# Patient Record
Sex: Male | Born: 1960 | Race: White | Hispanic: No | Marital: Married | Smoking: Former smoker
Health system: Southern US, Community
[De-identification: ages and names within clinical notes are randomized; demographics above are authoritative.]

## PROBLEM LIST (undated history)

## (undated) DIAGNOSIS — D126 Benign neoplasm of colon, unspecified: Secondary | ICD-10-CM

## (undated) DIAGNOSIS — K219 Gastro-esophageal reflux disease without esophagitis: Secondary | ICD-10-CM

## (undated) DIAGNOSIS — H9190 Unspecified hearing loss, unspecified ear: Secondary | ICD-10-CM

## (undated) DIAGNOSIS — K279 Peptic ulcer, site unspecified, unspecified as acute or chronic, without hemorrhage or perforation: Secondary | ICD-10-CM

## (undated) DIAGNOSIS — M8580 Other specified disorders of bone density and structure, unspecified site: Secondary | ICD-10-CM

## (undated) DIAGNOSIS — E119 Type 2 diabetes mellitus without complications: Secondary | ICD-10-CM

## (undated) DIAGNOSIS — E785 Hyperlipidemia, unspecified: Secondary | ICD-10-CM

## (undated) HISTORY — DX: Other specified disorders of bone density and structure, unspecified site: M85.80

## (undated) HISTORY — DX: Peptic ulcer, site unspecified, unspecified as acute or chronic, without hemorrhage or perforation: K27.9

## (undated) HISTORY — DX: Type 2 diabetes mellitus without complications: E11.9

## (undated) HISTORY — DX: Benign neoplasm of colon, unspecified: D12.6

## (undated) HISTORY — PX: HAND SURGERY: SHX662

## (undated) HISTORY — DX: Gilbert syndrome: E80.4

## (undated) HISTORY — DX: Hyperlipidemia, unspecified: E78.5

## (undated) HISTORY — DX: Gastro-esophageal reflux disease without esophagitis: K21.9

## (undated) HISTORY — PX: KNEE SURGERY: SHX244

## (undated) HISTORY — DX: Unspecified hearing loss, unspecified ear: H91.90

---

## 1999-07-13 ENCOUNTER — Encounter: Admission: RE | Admit: 1999-07-13 | Discharge: 1999-10-11 | Payer: Self-pay | Admitting: *Deleted

## 2000-02-29 ENCOUNTER — Encounter: Payer: Self-pay | Admitting: *Deleted

## 2000-02-29 ENCOUNTER — Encounter: Admission: RE | Admit: 2000-02-29 | Discharge: 2000-02-29 | Payer: Self-pay | Admitting: *Deleted

## 2000-08-30 ENCOUNTER — Encounter: Admission: RE | Admit: 2000-08-30 | Discharge: 2000-08-30 | Payer: Self-pay | Admitting: *Deleted

## 2000-08-30 ENCOUNTER — Encounter: Payer: Self-pay | Admitting: *Deleted

## 2004-07-16 ENCOUNTER — Encounter: Admission: RE | Admit: 2004-07-16 | Discharge: 2004-07-16 | Payer: Self-pay | Admitting: Internal Medicine

## 2005-11-10 ENCOUNTER — Encounter: Admission: RE | Admit: 2005-11-10 | Discharge: 2005-11-10 | Payer: Self-pay | Admitting: Internal Medicine

## 2006-09-18 ENCOUNTER — Emergency Department (HOSPITAL_COMMUNITY): Admission: EM | Admit: 2006-09-18 | Discharge: 2006-09-18 | Payer: Self-pay

## 2007-03-08 ENCOUNTER — Encounter (INDEPENDENT_AMBULATORY_CARE_PROVIDER_SITE_OTHER): Payer: Self-pay | Admitting: Specialist

## 2007-03-08 ENCOUNTER — Ambulatory Visit (HOSPITAL_COMMUNITY): Admission: RE | Admit: 2007-03-08 | Discharge: 2007-03-08 | Payer: Self-pay | Admitting: Gastroenterology

## 2008-04-29 ENCOUNTER — Encounter: Admission: RE | Admit: 2008-04-29 | Discharge: 2008-04-29 | Payer: Self-pay | Admitting: Internal Medicine

## 2008-06-07 ENCOUNTER — Ambulatory Visit (HOSPITAL_BASED_OUTPATIENT_CLINIC_OR_DEPARTMENT_OTHER): Admission: RE | Admit: 2008-06-07 | Discharge: 2008-06-07 | Payer: Self-pay | Admitting: Orthopedic Surgery

## 2009-07-07 ENCOUNTER — Emergency Department (HOSPITAL_COMMUNITY): Admission: EM | Admit: 2009-07-07 | Discharge: 2009-07-07 | Payer: Self-pay | Admitting: Family Medicine

## 2011-05-04 NOTE — Op Note (Signed)
Louis Joseph, BRIGHAM            ACCOUNT NO.:  [id number]   MEDICAL RECORD NO.:  [id number]          PATIENT TYPE:  AMB   LOCATION:  DSC                          FACILITY:  [facility]   PHYSICIAN:  Katy Fitch. Sypher, M.D. 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   OPERATION:  Release of right long finger A1 pulley.   OPERATING SURGEON:  Katy Fitch. Sypher, MD   ASSISTANT:  Marveen Reeks. Dasnoit, PA-C.   ANESTHESIA:  General sedation and 2% lidocaine, metacarpal head-level  block, right long finger.   SUPERVISING ANESTHESIOLOGIST:  Quita Skye. Krista Blue, MD   INDICATIONS:  Arrion Broaddus is a 50 year old gentleman, referred  through the courtesy of Dr. Rodrigo Ran for evaluation and management of  chronic stenosing tenosynovitis of the right long finger.  He has type 2  diabetes, managed by glimepiride, metformin, and Lantus insulin.   He has developed chronic stenosing tenosynovitis of his right long  finger, unresponsive to nonoperative measures.   He is now brought to the operating at this time for release of his right  long finger A1 pulley.   PROCEDURE:  Lalla Brothers was brought to the operating room and  placed in supine position upon the operating table.   Following light sedation, the right arm was prepped with Betadine soap  solution and sterilely draped.  A pneumatic tourniquet was applied to  the proximal right brachium.   A  2% lidocaine was infiltrated in the path of the intended incision  followed by exsanguination of the limb with an Esmarch bandage and  inflation of the arterial tourniquet to 230 mmHg.   A 1-cm incision was fashioned directly over  the thickened A1 pulley.  Subcutaneous tissues were carefully divided revealing hypertrophic  palmar fascia, particularly the pretendinous fibers to long finger.  This was likely related to his diabetes.  This was released with  scissors followed by identification of the A1 pulley.  The pulley was  split with the scalpel and scissors.  A small A0 pulley was noted.  This  was also split.   The tendons were delivered.  A small area of necrotic tendon tissue was  debrided with scissors and a micro-rongeur.   Thereafter, full range of motion of fingers recovered.   The wound was then repaired with mattress suture of 5-0 nylon.   A compressor dressing was applied with Xeroflo sterile gauze and an Ace  bandage.   Mr. Rivenburg would be discharged with a prescription for Darvocet-N 100 one  p.o. q.4-6 hours p.r.n. pain, 20 tablets without refill.      Katy Fitch Sypher, M.D.  Electronically Signed     RVS/MEDQ  D:  06/07/2008  T:  06/07/2008  Job:  [id number]   cc:   Loraine Leriche A. Perini, M.D.

## 2011-05-07 NOTE — Op Note (Signed)
Louis Joseph, Louis Joseph            ACCOUNT NO.:  [id number]   MEDICAL RECORD NO.:  [id number]          PATIENT TYPE:  AMB   LOCATION:  ENDO                         FACILITY:  [facility]   PHYSICIAN:  Danise Edge, M.D.   DATE OF BIRTH:  [birth year]   DATE OF PROCEDURE:  03/08/2007  DATE OF DISCHARGE:                               OPERATIVE REPORT   REFERRING PHYSICIAN:  Mark A. Perini, M.D.   PROCEDURE INDICATIONS:  [name] is a 50 year old being male  born [birth year].  [name] is undergoing diagnostic  esophagogastroduodenoscopy to evaluate intermittent dysphagia and  intermittent breakthrough heartburn despite taking Prevacid twice daily.   [name] underwent an esophagogastroduodenoscopy in his early 81s.   ENDOSCOPIST:  Danise Edge, M.D.   PREMEDICATION:  Fentanyl 100 mcg, Versed 10 mg.   PROCEDURE:  After obtaining informed consent, [name] was placed in the  left lateral decubitus position on the fluoroscopy table.  I  administered intravenous fentanyl and intravenous Versed to achieve  conscious sedation for the procedure.  The patient's blood pressure,  oxygen saturation and cardiac rhythm were monitored throughout the  procedure and documented in the medical record.   The Olympus gastroscope was passed through the posterior hypopharynx  into the proximal esophagus without difficulty.  The hypopharynx, larynx  and vocal cords appeared normal.   Esophagoscopy:  The proximal mid and lower segments of the esophageal  mucosa appear completely normal.  The squamocolumnar junction and  esophagogastric junction are noted at approximately 40 cm from the  incisor teeth.  There is no endoscopic evidence for the presence of  esophageal obstruction, esophageal stricture formation, erosive  esophagitis or Barrett's esophagus.   Gastroscopy:  Retroflex view of the gastric cardia and fundus was  normal.  The gastric body appeared normal.  There are scattered small  circular erosions in the prepyloric gastric antrum.  Biopsies were taken  from the prepyloric gastric antrum.  The pylorus appears normal.   Duodenoscopy:  The duodenal bulb, second portion of duodenum and third  portion of duodenum appeared normal.   Biopsies:  Multiple biopsies were taken from the esophagus to rule out  eosinophilic esophagitis.  Prepyloric gastric biopsies were performed to  evaluate the gastric erosions.           ______________________________  Danise Edge, M.D.     MJ/MEDQ  D:  03/08/2007  T:  03/08/2007  Job:  [id number]   cc:   Loraine Leriche A. Perini, M.D.

## 2011-07-05 ENCOUNTER — Other Ambulatory Visit: Payer: Self-pay | Admitting: Gastroenterology

## 2011-09-16 LAB — BASIC METABOLIC PANEL
CO2: 31
Calcium: 9.4
Glucose, Bld: 191 — ABNORMAL HIGH
Sodium: 139

## 2012-12-02 ENCOUNTER — Emergency Department (HOSPITAL_COMMUNITY)
Admission: EM | Admit: 2012-12-02 | Discharge: 2012-12-02 | Disposition: A | Discharge status: Home / Self Care | Payer: BC Managed Care – PPO | Source: Home / Self Care | Attending: Emergency Medicine | Admitting: Emergency Medicine

## 2012-12-02 ENCOUNTER — Encounter (HOSPITAL_COMMUNITY): Payer: Self-pay | Admitting: Emergency Medicine

## 2012-12-02 ENCOUNTER — Emergency Department (INDEPENDENT_AMBULATORY_CARE_PROVIDER_SITE_OTHER): Payer: BC Managed Care – PPO

## 2012-12-02 DIAGNOSIS — S61409A Unspecified open wound of unspecified hand, initial encounter: Secondary | ICD-10-CM

## 2012-12-02 DIAGNOSIS — S61419A Laceration without foreign body of unspecified hand, initial encounter: Secondary | ICD-10-CM

## 2012-12-02 HISTORY — DX: Type 2 diabetes mellitus without complications: E11.9

## 2012-12-02 MED ORDER — IBUPROFEN 600 MG PO TABS: 600.0000 mg | ORAL_TABLET | Freq: Four times a day (QID) | ORAL | Status: DC | PRN

## 2012-12-02 NOTE — ED Notes (Signed)
Pt c/o laceration of the left hand between thumb and index finger. Pt states that around 10 a.m he was removing a door off of a stove and the spring caused laceration. Pt is able to open and close hand.   Pt thinks last tetanus has been within a five year period

## 2012-12-02 NOTE — ED Provider Notes (Signed)
History     CSN: [id number]  Arrival date & time 12/02/12  1215   First MD Initiated Contact with Patient 12/02/12 1331      Chief Complaint  Patient presents with  . Extremity Laceration    laceration to left hand between thumb and index finger    (Consider location/radiation/quality/duration/timing/severity/associated sxs/prior treatment) HPI Comments: Today, while working in installing a new stove Pt c/o laceration of the left hand between thumb and index finger. Pt states that around 10 a.m he was removing a door off of a stove and the spring caused laceration. Denies any numbness, or tingling sensation. Able to move his thumb but with pain. (Patient points to the first metacarpophalangeal joint)   Patient is a 51 y.o. male presenting with skin laceration. The history is provided by the patient.  Laceration  The incident occurred 1 to 2 hours ago. The laceration is located on the left arm. The laceration is 2 cm in size. The pain is at a severity of 5/10. The pain is moderate. The pain has been constant since onset. He reports no foreign bodies present. His tetanus status is UTD.    Past Medical History  Diagnosis Date  . Diabetes mellitus without complication     Past Surgical History  Procedure Date  . Knee surgery   . Hand surgery     Family History  Problem Relation Age of Onset  . Diabetes Other     History  Substance Use Topics  . Smoking status: Current Some Day Smoker -- 0.5 packs/day    Types: Cigarettes  . Smokeless tobacco: Not on file  . Alcohol Use: No      Review of Systems  Constitutional: Positive for activity change. Negative for fever, chills, diaphoresis, appetite change, fatigue and unexpected weight change.  Skin: Positive for wound. Negative for color change and pallor.  Neurological: Negative for weakness and numbness.    Allergies  Codeine  Home Medications   Current Outpatient Rx  Name  Route  Sig  Dispense  Refill  .  INSULIN PUMP   Subcutaneous   Inject into the skin.         Marland Kitchen PREVACID 24HR PO   Oral   Take by mouth.         . METFORMIN HCL PO   Oral   Take by mouth.           BP 122/76  Pulse 58  Temp 98.1 F (36.7 C) (Oral)  Resp 17  SpO2 98%  Physical Exam  Nursing note and vitals reviewed. Constitutional: Vital signs are normal. He appears well-developed and well-nourished.  Non-toxic appearance. He does not have a sickly appearance. He does not appear ill.  Musculoskeletal: He exhibits tenderness.       Left hand: He exhibits decreased range of motion, tenderness, bony tenderness and laceration. He exhibits normal two-point discrimination, normal capillary refill, no deformity and no swelling.       Hands: Neurological: He is alert.  Skin: No erythema.    ED Course  LACERATION REPAIR Performed by: Zyanne Schumm Authorized by: Jimmie Molly Consent: Verbal consent obtained. Risks and benefits: risks, benefits and alternatives were discussed Consent given by: patient Patient understanding: patient states understanding of the procedure being performed Patient identity confirmed: verbally with patient Body area: upper extremity Location details: left hand Tendon involvement: superficial Anesthesia: local infiltration Local anesthetic: lidocaine 1% without epinephrine Anesthetic total (ml): 5. Patient sedated: no Irrigation solution: saline Debridement:  minimal Skin closure: 4-0 Prolene Number of sutures: 4 Patient tolerance: Patient tolerated the procedure well with no immediate complications.   (including critical care time)  Labs Reviewed - No data to display No results found.   No diagnosis found.    MDM  Laceration interdigital space- 1st-2nd L thumb neurological deficits- and no tendon injury suspected.        Jimmie Molly, MD 12/02/12 [phone]

## 2013-08-14 DIAGNOSIS — M25569 Pain in unspecified knee: Secondary | ICD-10-CM | POA: Insufficient documentation

## 2013-08-15 ENCOUNTER — Other Ambulatory Visit: Payer: Self-pay | Admitting: Orthopedic Surgery

## 2013-08-15 DIAGNOSIS — M25561 Pain in right knee: Secondary | ICD-10-CM

## 2013-08-24 ENCOUNTER — Ambulatory Visit
Admission: RE | Admit: 2013-08-24 | Discharge: 2013-08-24 | Disposition: A | Discharge status: Home / Self Care | Payer: BC Managed Care – PPO | Source: Ambulatory Visit | Attending: Orthopedic Surgery | Admitting: Orthopedic Surgery

## 2013-08-24 DIAGNOSIS — M25561 Pain in right knee: Secondary | ICD-10-CM

## 2014-10-23 ENCOUNTER — Other Ambulatory Visit: Payer: Self-pay | Admitting: Gastroenterology

## 2014-12-02 ENCOUNTER — Encounter (HOSPITAL_COMMUNITY)
Admission: RE | Disposition: A | Discharge status: Home / Self Care | Payer: Self-pay | Source: Ambulatory Visit | Attending: Gastroenterology

## 2014-12-02 ENCOUNTER — Encounter (HOSPITAL_COMMUNITY): Payer: Self-pay | Admitting: *Deleted

## 2014-12-02 ENCOUNTER — Ambulatory Visit (HOSPITAL_COMMUNITY)
Admission: RE | Admit: 2014-12-02 | Discharge: 2014-12-02 | Disposition: A | Discharge status: Home / Self Care | Payer: BC Managed Care – PPO | Source: Ambulatory Visit | Attending: Gastroenterology | Admitting: Gastroenterology

## 2014-12-02 DIAGNOSIS — D123 Benign neoplasm of transverse colon: Secondary | ICD-10-CM | POA: Insufficient documentation

## 2014-12-02 DIAGNOSIS — E780 Pure hypercholesterolemia: Secondary | ICD-10-CM | POA: Insufficient documentation

## 2014-12-02 DIAGNOSIS — Z1211 Encounter for screening for malignant neoplasm of colon: Secondary | ICD-10-CM | POA: Diagnosis present

## 2014-12-02 DIAGNOSIS — M8580 Other specified disorders of bone density and structure, unspecified site: Secondary | ICD-10-CM | POA: Diagnosis not present

## 2014-12-02 DIAGNOSIS — E119 Type 2 diabetes mellitus without complications: Secondary | ICD-10-CM | POA: Diagnosis not present

## 2014-12-02 HISTORY — PX: COLONOSCOPY: SHX5424

## 2014-12-02 LAB — GLUCOSE, CAPILLARY: GLUCOSE-CAPILLARY: 181 mg/dL — AB (ref 70–99)

## 2014-12-02 SURGERY — COLONOSCOPY
Anesthesia: Moderate Sedation

## 2014-12-02 MED ORDER — MIDAZOLAM HCL 5 MG/5ML IJ SOLN
INTRAMUSCULAR | Status: DC | PRN
  Administered 2014-12-02 (×4): 2.5 mg via INTRAVENOUS

## 2014-12-02 MED ORDER — FENTANYL CITRATE 0.05 MG/ML IJ SOLN
INTRAMUSCULAR | Status: DC | PRN
  Administered 2014-12-02: 50 ug via INTRAVENOUS
  Administered 2014-12-02: 25 ug via INTRAVENOUS

## 2014-12-02 MED ORDER — DIPHENHYDRAMINE HCL 50 MG/ML IJ SOLN
INTRAMUSCULAR | Status: AC
  Filled 2014-12-02: qty 1

## 2014-12-02 MED ORDER — FENTANYL CITRATE 0.05 MG/ML IJ SOLN
INTRAMUSCULAR | Status: AC
  Filled 2014-12-02: qty 4

## 2014-12-02 MED ORDER — MIDAZOLAM HCL 10 MG/2ML IJ SOLN
INTRAMUSCULAR | Status: AC
  Filled 2014-12-02: qty 4

## 2014-12-02 NOTE — H&P (Signed)
  Procedure: Surveillance colonoscopy. Colonoscopy with removal of nine adenomatous colon polyps performed on 07/04/2011  History: The patient is a 53 year old male born 610/25/1962. He is scheduled to undergo a surveillance colonoscopy with polypectomy to prevent colon cancer.  Medication allergies: None  Past medical history: Knee arthroscopy. Hypercholesterolemia. Type 2 diabetes mellitus. Degenerative joint disease. Osteopenia. Peptic ulcer disease.  Exam: The patient is alert and lying comfortably on the endoscopy stretcher. Abdomen is soft and nontender to palpation. Lungs are clear to auscultation. Cardiac exam reveals a regular rhythm.  Plan: Proceed with surveillance colonoscopy

## 2014-12-02 NOTE — Discharge Instructions (Signed)

## 2014-12-02 NOTE — Op Note (Signed)
Procedure: Surveillance colonoscopy. Colonoscopy with removal of nine adenomatous colon polyps performed on 07/04/2011  Endoscopist: Earle Gell  Premedication: Versed 10 mg. Fentanyl 75 g.  Procedure: The patient was placed in the left lateral decubitus position. Anal inspection and digital rectal exam were normal. The Pentax pediatric colonoscope was introduced into the rectum and advanced to the cecum. A normal-appearing ileocecal valve and appendiceal orifice were identified. Colonic preparation for the exam today was good. Withdrawal time was 23 minutes.  Rectum. Normal. Retroflexed view of the distal rectum normal  Sigmoid colon and descending colon. Normal  Splenic flexure. Normal  Transverse colon. From the distal transverse colon, two 4 mm sessile polyps were removed with the cold snare  Hepatic flexure. Normal  Ascending colon. Normal  Cecum and ileocecal valve. Normal  Assessment: Two 4 mm sessile polyps were removed from the distal transverse colon; otherwise normal surveillance colonoscopy  Recommendation: Schedule repeat surveillance colonoscopy in 5 years

## 2014-12-03 ENCOUNTER — Encounter (HOSPITAL_COMMUNITY): Payer: Self-pay | Admitting: Gastroenterology

## 2015-03-17 ENCOUNTER — Encounter: Payer: Self-pay | Admitting: *Deleted

## 2015-03-19 ENCOUNTER — Telehealth: Payer: Self-pay

## 2015-03-19 ENCOUNTER — Encounter: Payer: Self-pay | Admitting: Cardiology

## 2015-03-19 ENCOUNTER — Ambulatory Visit: Payer: BC Managed Care – PPO | Admitting: Cardiology

## 2015-03-19 ENCOUNTER — Ambulatory Visit (INDEPENDENT_AMBULATORY_CARE_PROVIDER_SITE_OTHER): Payer: BC Managed Care – PPO | Admitting: Cardiology

## 2015-03-19 VITALS — BP 120/72 | HR 67 | Ht 70.5 in | Wt 197.2 lb

## 2015-03-19 DIAGNOSIS — R0602 Shortness of breath: Secondary | ICD-10-CM | POA: Diagnosis not present

## 2015-03-19 DIAGNOSIS — K219 Gastro-esophageal reflux disease without esophagitis: Secondary | ICD-10-CM | POA: Diagnosis not present

## 2015-03-19 DIAGNOSIS — M8580 Other specified disorders of bone density and structure, unspecified site: Secondary | ICD-10-CM | POA: Insufficient documentation

## 2015-03-19 DIAGNOSIS — K279 Peptic ulcer, site unspecified, unspecified as acute or chronic, without hemorrhage or perforation: Secondary | ICD-10-CM | POA: Insufficient documentation

## 2015-03-19 DIAGNOSIS — E785 Hyperlipidemia, unspecified: Secondary | ICD-10-CM | POA: Diagnosis not present

## 2015-03-19 DIAGNOSIS — E119 Type 2 diabetes mellitus without complications: Secondary | ICD-10-CM

## 2015-03-19 DIAGNOSIS — D126 Benign neoplasm of colon, unspecified: Secondary | ICD-10-CM | POA: Insufficient documentation

## 2015-03-19 DIAGNOSIS — H9190 Unspecified hearing loss, unspecified ear: Secondary | ICD-10-CM | POA: Insufficient documentation

## 2015-03-19 HISTORY — DX: Hyperlipidemia, unspecified: E78.5

## 2015-03-19 HISTORY — DX: Gastro-esophageal reflux disease without esophagitis: K21.9

## 2015-03-19 HISTORY — DX: Type 2 diabetes mellitus without complications: E11.9

## 2015-03-19 NOTE — Patient Instructions (Signed)
Your physician has requested that you have an echocardiogram. Echocardiography is a painless test that uses sound waves to create images of your heart. It provides your doctor with information about the size and shape of your heart and how well your heart's chambers and valves are working. This procedure takes approximately one hour. There are no restrictions for this procedure.  Dr. Radford Pax recommends you have a NUCLEAR STRESS TEST.  Your physician recommends that you schedule a follow-up appointment AS NEEDED with Dr. Radford Pax pending your test results.

## 2015-03-19 NOTE — Telephone Encounter (Signed)
Called patient to see if he could come in early for his appointment today at 1:00PM or 3:00PM instead of 3:30PM.  Left message to call back.

## 2015-03-19 NOTE — Progress Notes (Signed)
Cardiology Office Note   Date:  03/19/2015   ID:  Louis Joseph, Louis Joseph Apr 16, 1961, MRN [id number]  PCP:  Jerlyn Ly, MD    Chief Complaint  Patient presents with  . Shortness of Breath      History of Present Illness: Louis Joseph is a 54 y.o. male who presents for evaluation of SOB.  He says that he has noticed that he gets SOB when he exercises or dose anything strenuous.  He also has noticed at night that he cant take a full breath when he is sitting down at night.  He denies any chest pain or pressure.  He normally walks or jogs up to 2 miles last year but now cannot even do .5 miles without SOB.  He denies any LE edema, dizziness or syncope.  He occasionally notices his heart speeding up. He smoked as a teenager.  He has a family history of premature CAD with his Dad.  His Dad used to be a smoker and everyone on his Dad's side of the family has had heart problems.      Past Medical History  Diagnosis Date  . Diabetes mellitus without complication   . Dyslipidemia 03/19/2015  . GERD (gastroesophageal reflux disease) 03/19/2015  . Gilbert's syndrome   . Osteopenia   . PUD (peptic ulcer disease)   . Colon adenomas   . Hearing loss     s/p hearing aides  . DM w/o complication type II 8/92/1194    Past Surgical History  Procedure Laterality Date  . Knee surgery    . Hand surgery    . Colonoscopy N/A 12/02/2014    Procedure: COLONOSCOPY;  Surgeon: Garlan Fair, MD;  Location: [facility];  Service: Endoscopy;  Laterality: N/A;     Current Outpatient Prescriptions  Medication Sig Dispense Refill  . aspirin 81 MG tablet Take 81 mg by mouth daily. Pt takes 1 tablet 4 times a week    . CALCIUM-VITAMIN D PO Take 1 tablet by mouth 2 (two) times daily.    Marland Kitchen ibuprofen (ADVIL,MOTRIN) 600 MG tablet Take 1 tablet (600 mg total) by mouth every 6 (six) hours as needed for pain. 30 tablet 0  . Insulin Human (INSULIN PUMP) 100 unit/ml SOLN Inject into the skin.      . Lansoprazole (PREVACID 24HR PO) Take by mouth.    . metFORMIN (GLUCOPHAGE) 1000 MG tablet Take 1,000 mg by mouth 2 (two) times daily.    . ONE TOUCH ULTRA TEST test strip     . pantoprazole (PROTONIX) 40 MG tablet Take 40 mg by mouth 2 (two) times daily.    . pravastatin (PRAVACHOL) 40 MG tablet Take 40 mg by mouth daily. Pt takes 1/2 tablet by mouth in the evening.     No current facility-administered medications for this visit.    Allergies:   Codeine    Social History:  The patient  reports that he has never smoked. He has never used smokeless tobacco. He reports that he does not drink alcohol or use illicit drugs.   Family History:  The patient's family history includes Diabetes in his father and other; Heart disease in his father; Hypertension in his father and sister; Uterine cancer in his mother.    ROS:  Please see the history of present illness.   Otherwise, review of systems are positive for none.   All other systems are reviewed and negative.    PHYSICAL EXAM: VS:  BP 120/72  mmHg  Pulse 67  Ht 5' 10.5" (1.791 m)  Wt 197 lb 3.2 oz (89.449 kg)  BMI 27.89 kg/m2 , BMI Body mass index is 27.89 kg/(m^2). GEN: Well nourished, well developed, in no acute distress HEENT: normal Neck: no JVD, carotid bruits, or masses Cardiac: RRR; no murmurs, rubs, or gallops,no edema  Respiratory:  clear to auscultation bilaterally, normal work of breathing GI: soft, nontender, nondistended, + BS MS: no deformity or atrophy Skin: warm and dry, no rash Neuro:  Strength and sensation are intact Psych: euthymic mood, full affect   EKG:  EKG is ordered today. The ekg ordered today demonstrates    Recent Labs: No results found for requested labs within last 365 days.    Lipid Panel No results found for: CHOL, TRIG, HDL, CHOLHDL, VLDL, LDLCALC, LDLDIRECT    Wt Readings from Last 3 Encounters:  03/19/15 197 lb 3.2 oz (89.449 kg)  12/02/14 195 lb (88.451 kg)        ASSESSMENT  AND PLAN:  1.  SOB mainly with exertion.  He has CRF including DM, dyslipidemia and family history of premature CAD.  EKG is nonischemic.  I will get a stress myoview to rule out ischemia.  I will also get a 2D echo to assess for structural heart disease.  2.  Dyslipidemia on statins 3.  GERD 4.  DM - per PCP   Current medicines are reviewed at length with the patient today.  The patient does not have concerns regarding medicines.  The following changes have been made:  no change  Labs/ tests ordered today include: see above assessment and plan  Orders Placed This Encounter  Procedures  . Myocardial Perfusion Imaging  . EKG 12-Lead  . 2D Echocardiogram without contrast     Disposition:   FU with me PRN pending results of studies   Signed, Sueanne Margarita, MD  03/19/2015 1:50 PM    [facility] [address], [place], [place]  [address] Phone: [phone]; Fax: [phone]

## 2015-03-26 ENCOUNTER — Ambulatory Visit: Payer: BC Managed Care – PPO | Admitting: Cardiology

## 2015-04-14 ENCOUNTER — Ambulatory Visit (HOSPITAL_BASED_OUTPATIENT_CLINIC_OR_DEPARTMENT_OTHER): Payer: BC Managed Care – PPO | Admitting: Radiology

## 2015-04-14 ENCOUNTER — Ambulatory Visit (HOSPITAL_COMMUNITY): Payer: BC Managed Care – PPO | Attending: Cardiology | Admitting: Cardiology

## 2015-04-14 DIAGNOSIS — E785 Hyperlipidemia, unspecified: Secondary | ICD-10-CM | POA: Diagnosis not present

## 2015-04-14 DIAGNOSIS — R002 Palpitations: Secondary | ICD-10-CM | POA: Insufficient documentation

## 2015-04-14 DIAGNOSIS — R0602 Shortness of breath: Secondary | ICD-10-CM | POA: Insufficient documentation

## 2015-04-14 DIAGNOSIS — R079 Chest pain, unspecified: Secondary | ICD-10-CM | POA: Diagnosis not present

## 2015-04-14 DIAGNOSIS — E119 Type 2 diabetes mellitus without complications: Secondary | ICD-10-CM | POA: Diagnosis not present

## 2015-04-14 MED ORDER — TECHNETIUM TC 99M SESTAMIBI GENERIC - CARDIOLITE
11.0000 | Freq: Once | INTRAVENOUS | Status: AC | PRN
  Administered 2015-04-14: 11 via INTRAVENOUS

## 2015-04-14 MED ORDER — TECHNETIUM TC 99M SESTAMIBI GENERIC - CARDIOLITE
33.0000 | Freq: Once | INTRAVENOUS | Status: AC | PRN
  Administered 2015-04-14: 33 via INTRAVENOUS

## 2015-04-14 NOTE — Progress Notes (Signed)
Echo performed. 

## 2015-04-14 NOTE — Progress Notes (Signed)
[facility] [address] [place], [place] [address] [phone]    Cardiology Nuclear Med Study  Louis Joseph is a 54 y.o. male     MRN : [id number]     DOB: [birth year]  Procedure Date: 04/14/2015  Nuclear Med Background Indication for Stress Test:  Evaluation for Ischemia History:  No known CAD Cardiac Risk Factors: IDDM, and Dyslipidemia  Symptoms:  Chest Pain, DOE and Palpitations   Nuclear Pre-Procedure Caffeine/Decaff Intake:  7:00am chocolate chip muffin NPO After: 7:00am   Lungs:  clear O2 Sat: 97% on room air. IV 0.9% NS with Angio Cath:  20g  IV Site: R Antecubital x 1, tolerated well IV Started by:  Irven Baltimore, RN  Chest Size (in):  42 Cup Size: n/a  Height: 5\' 10"  (1.778 m)  Weight:  193 lb (87.544 kg)  BMI:  Body mass index is 27.69 kg/(m^2). Tech Comments: Fasting CBG was 147 at 0700 with 3/4 dose insulin bolus given via insulin pump per patient. No metformin this am. CBG was 257 at 09:30am. Irven Baltimore, RN.    Nuclear Med Study 1 or 2 day study: 1 day  Stress Test Type:  Stress  Reading MD: N/A  Order Authorizing Provider:  Fransico Him, MD  Resting Radionuclide: Technetium 6m Sestamibi  Resting Radionuclide Dose: 11.0 mCi   Stress Radionuclide:  Technetium 33m Sestamibi  Stress Radionuclide Dose: 33.0 mCi           Stress Protocol Rest HR: 54 Stress HR: 153  Rest BP: 131/80 Stress BP: 200/70  Exercise Time (min): 9:00 METS: 10.1   Predicted Max HR: 166 bpm % Max HR: 92.17 bpm Rate Pressure Product: 30600   Dose of Adenosine (mg):  n/a Dose of Lexiscan: n/a mg  Dose of Atropine (mg): n/a Dose of Dobutamine: n/a mcg/kg/min (at max HR)  Stress Test Technologist: Glade Lloyd, BS-ES  Nuclear Technologist:  Annye Rusk, CNMT     Rest Procedure:  Myocardial perfusion imaging was performed at rest 45 minutes following the intravenous administration of Technetium 27m Sestamibi. Rest ECG: Sinus bradycardia,  RAD.  Stress Procedure:  The patient exercised on the treadmill utilizing the Bruce Protocol for 9:00 minutes. The patient stopped due to SOB and denied any chest pain.  Technetium 35m Sestamibi was injected at peak exercise and myocardial perfusion imaging was performed after a brief delay. Stress ECG: No significant ST segment change suggestive of ischemia.  QPS Raw Data Images:  Acquisition technically good; normal left ventricular size. Stress Images:  There is decreased uptake in the anterior and inferior walls. Rest Images:  There is decreased uptake in the anterior and inferior walls. Subtraction (SDS):  No evidence of ischemia. Transient Ischemic Dilatation (Normal <1.22):  0.86 Lung/Heart Ratio (Normal <0.45):  0.24  Quantitative Gated Spect Images QGS EDV:  108 ml QGS ESV:  44 ml  Impression Exercise Capacity:  Good exercise capacity. BP Response:  Normal blood pressure response. Clinical Symptoms:  There is dyspnea. ECG Impression:  No significant ST segment change suggestive of ischemia. Comparison with Prior Nuclear Study: No previous nuclear study performed  Overall Impression:  Low risk stress nuclear study with small, moderate intensity, fixed inferior and anterior defects consistent with soft tissue attenuation; cannot exclude small prior infarct; no ischemia.  LV Ejection Fraction: 59%.  LV Wall Motion:  NL LV Function; NL Wall Motion  [name]

## 2015-04-15 ENCOUNTER — Encounter: Payer: Self-pay | Admitting: Cardiology

## 2015-04-15 NOTE — Telephone Encounter (Signed)
This encounter was created in error - please disregard.

## 2015-04-15 NOTE — Telephone Encounter (Signed)
Follow Up ° ° ° ° ° °Pt returning Katy's phone call for results. °

## 2015-09-05 ENCOUNTER — Inpatient Hospital Stay (HOSPITAL_COMMUNITY)
Admission: EM | Admit: 2015-09-05 | Discharge: 2015-09-08 | DRG: 872 | Disposition: A | Discharge status: Home / Self Care | Payer: BC Managed Care – PPO | Attending: Internal Medicine | Admitting: Internal Medicine

## 2015-09-05 ENCOUNTER — Encounter (HOSPITAL_COMMUNITY): Payer: Self-pay | Admitting: Emergency Medicine

## 2015-09-05 DIAGNOSIS — K529 Noninfective gastroenteritis and colitis, unspecified: Secondary | ICD-10-CM

## 2015-09-05 DIAGNOSIS — M791 Myalgia, unspecified site: Secondary | ICD-10-CM

## 2015-09-05 DIAGNOSIS — R509 Fever, unspecified: Secondary | ICD-10-CM | POA: Diagnosis not present

## 2015-09-05 DIAGNOSIS — E1165 Type 2 diabetes mellitus with hyperglycemia: Secondary | ICD-10-CM | POA: Diagnosis present

## 2015-09-05 DIAGNOSIS — Z794 Long term (current) use of insulin: Secondary | ICD-10-CM

## 2015-09-05 DIAGNOSIS — K279 Peptic ulcer, site unspecified, unspecified as acute or chronic, without hemorrhage or perforation: Secondary | ICD-10-CM | POA: Diagnosis present

## 2015-09-05 DIAGNOSIS — Z8249 Family history of ischemic heart disease and other diseases of the circulatory system: Secondary | ICD-10-CM

## 2015-09-05 DIAGNOSIS — R112 Nausea with vomiting, unspecified: Secondary | ICD-10-CM | POA: Diagnosis present

## 2015-09-05 DIAGNOSIS — E871 Hypo-osmolality and hyponatremia: Secondary | ICD-10-CM | POA: Diagnosis present

## 2015-09-05 DIAGNOSIS — Z8049 Family history of malignant neoplasm of other genital organs: Secondary | ICD-10-CM

## 2015-09-05 DIAGNOSIS — K219 Gastro-esophageal reflux disease without esophagitis: Secondary | ICD-10-CM | POA: Diagnosis present

## 2015-09-05 DIAGNOSIS — Z833 Family history of diabetes mellitus: Secondary | ICD-10-CM

## 2015-09-05 DIAGNOSIS — E785 Hyperlipidemia, unspecified: Secondary | ICD-10-CM | POA: Diagnosis present

## 2015-09-05 DIAGNOSIS — N179 Acute kidney failure, unspecified: Secondary | ICD-10-CM | POA: Diagnosis present

## 2015-09-05 DIAGNOSIS — E860 Dehydration: Secondary | ICD-10-CM | POA: Diagnosis present

## 2015-09-05 DIAGNOSIS — E119 Type 2 diabetes mellitus without complications: Secondary | ICD-10-CM

## 2015-09-05 DIAGNOSIS — M8580 Other specified disorders of bone density and structure, unspecified site: Secondary | ICD-10-CM | POA: Diagnosis present

## 2015-09-05 DIAGNOSIS — Z9641 Presence of insulin pump (external) (internal): Secondary | ICD-10-CM | POA: Diagnosis present

## 2015-09-05 DIAGNOSIS — A419 Sepsis, unspecified organism: Secondary | ICD-10-CM | POA: Diagnosis present

## 2015-09-05 DIAGNOSIS — R519 Headache, unspecified: Secondary | ICD-10-CM

## 2015-09-05 DIAGNOSIS — R51 Headache: Secondary | ICD-10-CM

## 2015-09-05 DIAGNOSIS — H9190 Unspecified hearing loss, unspecified ear: Secondary | ICD-10-CM

## 2015-09-05 DIAGNOSIS — R197 Diarrhea, unspecified: Secondary | ICD-10-CM

## 2015-09-05 LAB — URINE MICROSCOPIC-ADD ON

## 2015-09-05 LAB — CBC WITH DIFFERENTIAL/PLATELET
Basophils Absolute: 0 10*3/uL (ref 0.0–0.1)
Basophils Relative: 0 %
EOS ABS: 0 10*3/uL (ref 0.0–0.7)
EOS PCT: 0 %
HCT: 43.3 % (ref 39.0–52.0)
Hemoglobin: 15.8 g/dL (ref 13.0–17.0)
LYMPHS ABS: 0.8 10*3/uL (ref 0.7–4.0)
LYMPHS PCT: 8 %
MCH: 31.9 pg (ref 26.0–34.0)
MCHC: 36.5 g/dL — AB (ref 30.0–36.0)
MCV: 87.3 fL (ref 78.0–100.0)
MONO ABS: 0.6 10*3/uL (ref 0.1–1.0)
MONOS PCT: 7 %
Neutro Abs: 8.4 10*3/uL — ABNORMAL HIGH (ref 1.7–7.7)
Neutrophils Relative %: 85 %
PLATELETS: 180 10*3/uL (ref 150–400)
RBC: 4.96 MIL/uL (ref 4.22–5.81)
RDW: 11.8 % (ref 11.5–15.5)
WBC: 9.8 10*3/uL (ref 4.0–10.5)

## 2015-09-05 LAB — I-STAT CG4 LACTIC ACID, ED
LACTIC ACID, VENOUS: 1.77 mmol/L (ref 0.5–2.0)
Lactic Acid, Venous: 2.04 mmol/L (ref 0.5–2.0)

## 2015-09-05 LAB — URINALYSIS, ROUTINE W REFLEX MICROSCOPIC
BILIRUBIN URINE: NEGATIVE
Hgb urine dipstick: NEGATIVE
KETONES UR: 15 mg/dL — AB
LEUKOCYTES UA: NEGATIVE
Nitrite: NEGATIVE
PROTEIN: 30 mg/dL — AB
Specific Gravity, Urine: 1.037 — ABNORMAL HIGH (ref 1.005–1.030)
Urobilinogen, UA: 1 mg/dL (ref 0.0–1.0)
pH: 6.5 (ref 5.0–8.0)

## 2015-09-05 LAB — COMPREHENSIVE METABOLIC PANEL
ALK PHOS: 82 U/L (ref 38–126)
ALT: 31 U/L (ref 17–63)
ANION GAP: 10 (ref 5–15)
AST: 38 U/L (ref 15–41)
Albumin: 4 g/dL (ref 3.5–5.0)
BILIRUBIN TOTAL: 1.8 mg/dL — AB (ref 0.3–1.2)
BUN: 13 mg/dL (ref 6–20)
CALCIUM: 9.3 mg/dL (ref 8.9–10.3)
CO2: 22 mmol/L (ref 22–32)
Chloride: 99 mmol/L — ABNORMAL LOW (ref 101–111)
Creatinine, Ser: 1.3 mg/dL — ABNORMAL HIGH (ref 0.61–1.24)
GFR calc Af Amer: >60 mL/min (ref >60)
Glucose, Bld: 306 mg/dL — ABNORMAL HIGH (ref 65–99)
POTASSIUM: 3.9 mmol/L (ref 3.5–5.1)
Sodium: 131 mmol/L — ABNORMAL LOW (ref 135–145)
TOTAL PROTEIN: 6.8 g/dL (ref 6.5–8.1)

## 2015-09-05 LAB — CBG MONITORING, ED: GLUCOSE-CAPILLARY: 309 mg/dL — AB (ref 65–99)

## 2015-09-05 MED ORDER — SODIUM CHLORIDE 0.9 % IV BOLUS (SEPSIS)
1000.0000 mL | Freq: Once | INTRAVENOUS | Status: AC
  Administered 2015-09-05: 1000 mL via INTRAVENOUS

## 2015-09-05 MED ORDER — IBUPROFEN 800 MG PO TABS
800.0000 mg | ORAL_TABLET | Freq: Once | ORAL | Status: AC
  Administered 2015-09-05: 800 mg via ORAL

## 2015-09-05 NOTE — ED Provider Notes (Signed)
CSN: [id number]     Arrival date & time 09/05/15  1844 History   First MD Initiated Contact with Patient 09/05/15 2015     Chief Complaint  Patient presents with  . Fever  . Emesis     (Consider location/radiation/quality/duration/timing/severity/associated sxs/prior Treatment) HPI  This is a 54 year old man with past medical history of insulin-dependent diabetes, peptic ulcer disease, previous history of [place] spotted fever who presents emergency Department with 2 weeks of fever, headaches, nausea, vomiting and diarrhea. Patient states that his symptoms began over Labor Day weekend while he was visiting in [facility]. He had myalgias, fever, body aches, multiple episodes of diarrhea and vomiting, nonbloody, nonbilious vomitus. Patient states that over the weekend. He has progressively worsened. The patient saw his primary care physician 2 days ago, [facility] spotted fever titers were drawn and the patient was started on doxycycline 100 mg twice a day. He's had 4 doses. Patient reached a MAXIMUM TEMPERATURE of 103.1 earlier today, called his PCP and was told to come to the emergency department. He denies any current vomiting or nausea. Several episodes over the last 24 hours. He denies any current diarrhea, cough, urinary symptoms. He does complain of mild, constant, global headache which is worse after exertion or time outside in the heat. He denies photophobia, phonophobia, neck stiffness. Patient denies rash.  Past Medical History  Diagnosis Date  . Diabetes mellitus without complication   . Dyslipidemia 03/19/2015  . GERD (gastroesophageal reflux disease) 03/19/2015  . Gilbert's syndrome   . Osteopenia   . PUD (peptic ulcer disease)   . Colon adenomas   . Hearing loss     s/p hearing aides  . DM w/o complication type II 9/62/8366   Past Surgical History  Procedure Laterality Date  . Knee surgery    . Hand surgery    . Colonoscopy N/A 12/02/2014    Procedure:  COLONOSCOPY;  Surgeon: Garlan Fair, MD;  Location: [facility];  Service: Endoscopy;  Laterality: N/A;   Family History  Problem Relation Age of Onset  . Diabetes Other   . Uterine cancer Mother   . Heart disease Father   . Diabetes Father   . Hypertension Father   . Hypertension Sister    Social History  Substance Use Topics  . Smoking status: Never Smoker   . Smokeless tobacco: Never Used  . Alcohol Use: No    Review of Systems  Ten systems reviewed and are negative for acute change, except as noted in the HPI.    Allergies  Codeine  Home Medications   Prior to Admission medications   Medication Sig Start Date End Date Taking? Authorizing Provider  aspirin 81 MG tablet Take 81 mg by mouth daily. Pt takes 1 tablet 4 times a week   Yes Historical Provider, MD  CALCIUM-VITAMIN D PO Take 1 tablet by mouth 2 (two) times daily.   Yes Historical Provider, MD  doxycycline (VIBRA-TABS) 100 MG tablet Take 100 mg by mouth 2 (two) times daily. 09/04/15  Yes Historical Provider, MD  Insulin Human (INSULIN PUMP) 100 unit/ml SOLN Inject into the skin.   Yes Historical Provider, MD  Lansoprazole (PREVACID 24HR PO) Take 1 capsule by mouth daily.    Yes Historical Provider, MD  metFORMIN (GLUCOPHAGE) 1000 MG tablet Take 1,000 mg by mouth 2 (two) times daily. 03/17/15  Yes Historical Provider, MD  pantoprazole (PROTONIX) 40 MG tablet Take 40 mg by mouth 2 (two) times daily. 03/12/15  Yes Historical Provider, MD  pravastatin (PRAVACHOL) 40 MG tablet Take 20 mg by mouth daily. Pt takes 1/2 tablet by mouth in the evening. 02/13/15  Yes Historical Provider, MD   BP 121/56 mmHg  Pulse 83  Temp(Src) 101.3 F (38.5 C) (Oral)  Resp 23  Ht 5\' 11"  (1.803 m)  Wt 197 lb (89.359 kg)  BMI 27.49 kg/m2  SpO2 91% Physical Exam  Constitutional: He is oriented to person, place, and time. He appears well-developed and well-nourished. No distress.  HENT:  Head: Normocephalic and atraumatic.   Mouth/Throat: Oropharynx is clear and moist.  Eyes: Conjunctivae and EOM are normal. Pupils are equal, round, and reactive to light. No scleral icterus.  No horizontal, vertical or rotational nystagmus  Neck: Normal range of motion. Neck supple.  Full active and passive ROM without pain No midline or paraspinal tenderness No nuchal rigidity or meningeal signs  Cardiovascular: Normal rate, regular rhythm, normal heart sounds and intact distal pulses.   Pulmonary/Chest: Effort normal and breath sounds normal. No respiratory distress. He has no wheezes. He has no rales.  Abdominal: Soft. Bowel sounds are normal. He exhibits no distension and no mass. There is no tenderness. There is no rebound and no guarding.  Musculoskeletal: Normal range of motion. He exhibits no edema or tenderness.  Lymphadenopathy:    He has no cervical adenopathy.  Neurological: He is alert and oriented to person, place, and time. He has normal reflexes. No cranial nerve deficit. He exhibits normal muscle tone. Coordination normal.  Mental Status:  Alert, oriented, thought content appropriate. Speech fluent without evidence of aphasia. Able to follow 2 step commands without difficulty.  Cranial Nerves:  II:  Peripheral visual fields grossly normal, pupils equal, round, reactive to light III,IV, VI: ptosis not present, extra-ocular motions intact bilaterally  V,VII: smile symmetric, facial light touch sensation equal VIII: hearing grossly normal bilaterally  IX,X: midline uvula rise  XI: bilateral shoulder shrug equal and strong XII: midline tongue extension  Motor:  5/5 in upper and lower extremities bilaterally including strong and equal grip strength and dorsiflexion/plantar flexion Sensory: Pinprick and light touch normal in all extremities.  Deep Tendon Reflexes: 2+ and symmetric  Cerebellar: normal finger-to-nose with bilateral upper extremities Gait: normal gait and balance CV: distal pulses palpable  throughout   Skin: Skin is warm and dry. No rash noted. He is not diaphoretic.  Psychiatric: He has a normal mood and affect. His behavior is normal. Judgment and thought content normal.  Nursing note and vitals reviewed.   ED Course  Procedures (including critical care time) Labs Review Labs Reviewed  COMPREHENSIVE METABOLIC PANEL - Abnormal; Notable for the following:    Sodium 131 (*)    Chloride 99 (*)    Glucose, Bld 306 (*)    Creatinine, Ser 1.30 (*)    Total Bilirubin 1.8 (*)    All other components within normal limits  URINALYSIS, ROUTINE W REFLEX MICROSCOPIC (NOT AT [facility] - Abnormal; Notable for the following:    Specific Gravity, Urine 1.037 (*)    Glucose, UA >1000 (*)    Ketones, ur 15 (*)    Protein, ur 30 (*)    All other components within normal limits  CBC WITH DIFFERENTIAL/PLATELET - Abnormal; Notable for the following:    MCHC 36.5 (*)    Neutro Abs 8.4 (*)    All other components within normal limits  URINE MICROSCOPIC-ADD ON - Abnormal; Notable for the following:    Casts HYALINE  CASTS (*)    All other components within normal limits  I-STAT CG4 LACTIC ACID, ED - Abnormal; Notable for the following:    Lactic Acid, Venous 2.04 (*)    All other components within normal limits  CBG MONITORING, ED - Abnormal; Notable for the following:    Glucose-Capillary 309 (*)    All other components within normal limits  CSF CULTURE  GRAM STAIN  B. BURGDORFI ANTIBODIES, CSF  ECHOVIRUS ABS PANEL (CSF)  ROCKY MTN SPOTTED FVR ABS PNL(IGG+IGM)  B. BURGDORFI ANTIBODIES  CSF CELL COUNT WITH DIFFERENTIAL  CSF CELL COUNT WITH DIFFERENTIAL  GLUCOSE, CSF  PROTEIN, CSF  I-STAT CG4 LACTIC ACID, ED    Imaging Review No results found. I have personally reviewed and evaluated these images and lab results as part of my medical decision-making.   EKG Interpretation None      MDM   Final diagnoses:  Fever of unknown origin  Myalgia  Bad headache    Patient seen  in shared visit with Dr. Johnney Killian. Patient states initial i-STAT lactate at 2.04. He is given a bolus of fluid and lactate came down to 1.7. Patient with mild hyponatremia, elevated glucose level. Creatinine is slightly elevated. Last creatinine was 7 years ago so no current to compare it with. Urine appears negative, CBC unremarkable with no leukocytosis.  LP taken, cultures pending. Will need admission for meningitis R/O Given IV ROcephin.  Margarita Mail, PA-C 09/06/15 0142  Charlesetta Shanks, MD 09/16/15 1105

## 2015-09-05 NOTE — ED Notes (Signed)
"  Feelings bad" x2 weeks. Patient's PCP thinks he has [place] spotted fever. Patient states he has been naseous/vomiting/diarrhea. States he has been aching all over, including a headache. Advil at 1700 without relief of pain. Patient states his blood sugar was over 300 today.

## 2015-09-05 NOTE — ED Notes (Signed)
CONSENT FOR LUMBAR PUNCTURE SIGNED AT THIS TIME

## 2015-09-06 ENCOUNTER — Emergency Department (HOSPITAL_COMMUNITY): Payer: BC Managed Care – PPO

## 2015-09-06 ENCOUNTER — Inpatient Hospital Stay (HOSPITAL_COMMUNITY): Payer: BC Managed Care – PPO

## 2015-09-06 ENCOUNTER — Encounter (HOSPITAL_COMMUNITY): Payer: Self-pay | Admitting: *Deleted

## 2015-09-06 DIAGNOSIS — R509 Fever, unspecified: Secondary | ICD-10-CM | POA: Diagnosis present

## 2015-09-06 DIAGNOSIS — E119 Type 2 diabetes mellitus without complications: Secondary | ICD-10-CM | POA: Diagnosis not present

## 2015-09-06 DIAGNOSIS — Z794 Long term (current) use of insulin: Secondary | ICD-10-CM | POA: Diagnosis not present

## 2015-09-06 DIAGNOSIS — Z8049 Family history of malignant neoplasm of other genital organs: Secondary | ICD-10-CM | POA: Diagnosis not present

## 2015-09-06 DIAGNOSIS — E860 Dehydration: Secondary | ICD-10-CM | POA: Diagnosis present

## 2015-09-06 DIAGNOSIS — E1165 Type 2 diabetes mellitus with hyperglycemia: Secondary | ICD-10-CM | POA: Diagnosis present

## 2015-09-06 DIAGNOSIS — Z8249 Family history of ischemic heart disease and other diseases of the circulatory system: Secondary | ICD-10-CM | POA: Diagnosis not present

## 2015-09-06 DIAGNOSIS — Z833 Family history of diabetes mellitus: Secondary | ICD-10-CM | POA: Diagnosis not present

## 2015-09-06 DIAGNOSIS — A419 Sepsis, unspecified organism: Principal | ICD-10-CM

## 2015-09-06 DIAGNOSIS — K219 Gastro-esophageal reflux disease without esophagitis: Secondary | ICD-10-CM | POA: Diagnosis present

## 2015-09-06 DIAGNOSIS — R112 Nausea with vomiting, unspecified: Secondary | ICD-10-CM | POA: Diagnosis present

## 2015-09-06 DIAGNOSIS — N179 Acute kidney failure, unspecified: Secondary | ICD-10-CM | POA: Diagnosis present

## 2015-09-06 DIAGNOSIS — R519 Headache, unspecified: Secondary | ICD-10-CM | POA: Diagnosis present

## 2015-09-06 DIAGNOSIS — E871 Hypo-osmolality and hyponatremia: Secondary | ICD-10-CM | POA: Diagnosis present

## 2015-09-06 DIAGNOSIS — M8580 Other specified disorders of bone density and structure, unspecified site: Secondary | ICD-10-CM | POA: Diagnosis present

## 2015-09-06 DIAGNOSIS — Z9641 Presence of insulin pump (external) (internal): Secondary | ICD-10-CM | POA: Diagnosis present

## 2015-09-06 DIAGNOSIS — E785 Hyperlipidemia, unspecified: Secondary | ICD-10-CM | POA: Diagnosis present

## 2015-09-06 DIAGNOSIS — R197 Diarrhea, unspecified: Secondary | ICD-10-CM

## 2015-09-06 DIAGNOSIS — R51 Headache: Secondary | ICD-10-CM

## 2015-09-06 LAB — CSF CELL COUNT WITH DIFFERENTIAL
RBC COUNT CSF: 1 /mm3 — AB
RBC Count, CSF: 231 /mm3 — ABNORMAL HIGH
Tube #: 1
Tube #: 4
WBC CSF: 0 /mm3 (ref 0–5)
WBC, CSF: 1 /mm3 (ref 0–5)

## 2015-09-06 LAB — BASIC METABOLIC PANEL
ANION GAP: 7 (ref 5–15)
BUN: 12 mg/dL (ref 6–20)
CALCIUM: 7.6 mg/dL — AB (ref 8.9–10.3)
CHLORIDE: 105 mmol/L (ref 101–111)
CO2: 22 mmol/L (ref 22–32)
Creatinine, Ser: 1.1 mg/dL (ref 0.61–1.24)
GFR calc non Af Amer: >60 mL/min (ref >60)
Glucose, Bld: 216 mg/dL — ABNORMAL HIGH (ref 65–99)
POTASSIUM: 3.8 mmol/L (ref 3.5–5.1)
Sodium: 134 mmol/L — ABNORMAL LOW (ref 135–145)

## 2015-09-06 LAB — CBC
HCT: 35.6 % — ABNORMAL LOW (ref 39.0–52.0)
HEMOGLOBIN: 12.8 g/dL — AB (ref 13.0–17.0)
MCH: 31.4 pg (ref 26.0–34.0)
MCHC: 36 g/dL (ref 30.0–36.0)
MCV: 87.3 fL (ref 78.0–100.0)
Platelets: 139 10*3/uL — ABNORMAL LOW (ref 150–400)
RBC: 4.08 MIL/uL — AB (ref 4.22–5.81)
RDW: 11.8 % (ref 11.5–15.5)
WBC: 5.8 10*3/uL (ref 4.0–10.5)

## 2015-09-06 LAB — LIPID PANEL
CHOL/HDL RATIO: 2.8 ratio
Cholesterol: 103 mg/dL (ref 0–200)
HDL: 37 mg/dL — AB (ref >40)
LDL CALC: 52 mg/dL (ref 0–99)
Triglycerides: 70 mg/dL (ref <150)
VLDL: 14 mg/dL (ref 0–40)

## 2015-09-06 LAB — PROTIME-INR
INR: 1.16 (ref 0.00–1.49)
Prothrombin Time: 15 seconds (ref 11.6–15.2)

## 2015-09-06 LAB — C DIFFICILE QUICK SCREEN W PCR REFLEX
C Diff antigen: NEGATIVE
C Diff interpretation: NEGATIVE
C Diff toxin: NEGATIVE

## 2015-09-06 LAB — GLUCOSE, CAPILLARY
GLUCOSE-CAPILLARY: 230 mg/dL — AB (ref 65–99)
GLUCOSE-CAPILLARY: 211 mg/dL — AB (ref 65–99)
Glucose-Capillary: 189 mg/dL — ABNORMAL HIGH (ref 65–99)

## 2015-09-06 LAB — SEDIMENTATION RATE: SED RATE: 10 mm/h (ref 0–16)

## 2015-09-06 LAB — GLUCOSE, CSF: GLUCOSE CSF: 140 mg/dL — AB (ref 40–70)

## 2015-09-06 LAB — PROTEIN, CSF: Total  Protein, CSF: 26 mg/dL (ref 15–45)

## 2015-09-06 LAB — LACTIC ACID, PLASMA
LACTIC ACID, VENOUS: 0.9 mmol/L (ref 0.5–2.0)
Lactic Acid, Venous: 1 mmol/L (ref 0.5–2.0)

## 2015-09-06 LAB — LIPASE, BLOOD: Lipase: 17 U/L — ABNORMAL LOW (ref 22–51)

## 2015-09-06 LAB — CBG MONITORING, ED: Glucose-Capillary: 175 mg/dL — ABNORMAL HIGH (ref 65–99)

## 2015-09-06 LAB — HIV ANTIBODY (ROUTINE TESTING W REFLEX): HIV SCREEN 4TH GENERATION: NONREACTIVE

## 2015-09-06 LAB — PROCALCITONIN: Procalcitonin: 0.31 ng/mL

## 2015-09-06 LAB — APTT: aPTT: 29 seconds (ref 24–37)

## 2015-09-06 MED ORDER — DEXTROSE 5 % IV SOLN
1.0000 g | Freq: Once | INTRAVENOUS | Status: AC
  Administered 2015-09-06: 1 g via INTRAVENOUS
  Filled 2015-09-06: qty 10

## 2015-09-06 MED ORDER — ACETAMINOPHEN 325 MG PO TABS
650.0000 mg | ORAL_TABLET | Freq: Four times a day (QID) | ORAL | Status: DC | PRN
  Administered 2015-09-06: 650 mg via ORAL
  Filled 2015-09-06: qty 2

## 2015-09-06 MED ORDER — DEXTROSE 5 % IV SOLN
750.0000 mg | Freq: Three times a day (TID) | INTRAVENOUS | Status: DC
  Administered 2015-09-06 – 2015-09-07 (×3): 750 mg via INTRAVENOUS
  Filled 2015-09-06 (×5): qty 15

## 2015-09-06 MED ORDER — IOHEXOL 300 MG/ML  SOLN
25.0000 mL | INTRAMUSCULAR | Status: AC
  Administered 2015-09-06 (×2): 25 mL via ORAL

## 2015-09-06 MED ORDER — DEXTROSE 5 % IV SOLN: 1.0000 g | INTRAVENOUS | Status: DC

## 2015-09-06 MED ORDER — INSULIN PUMP
Freq: Three times a day (TID) | SUBCUTANEOUS | Status: DC
  Administered 2015-09-06: 1.9 via SUBCUTANEOUS
  Administered 2015-09-06: 03:00:00 via SUBCUTANEOUS
  Administered 2015-09-06: 2.7 via SUBCUTANEOUS
  Administered 2015-09-06: 2.4 via SUBCUTANEOUS
  Administered 2015-09-07: 22:00:00 via SUBCUTANEOUS
  Administered 2015-09-07: 1.1 via SUBCUTANEOUS
  Administered 2015-09-07: 2.5 via SUBCUTANEOUS
  Administered 2015-09-07: 7.1 via SUBCUTANEOUS
  Administered 2015-09-08: 2.4 via SUBCUTANEOUS
  Filled 2015-09-06: qty 1

## 2015-09-06 MED ORDER — LOPERAMIDE HCL 2 MG PO CAPS
2.0000 mg | ORAL_CAPSULE | ORAL | Status: DC | PRN
  Administered 2015-09-06 – 2015-09-07 (×2): 2 mg via ORAL
  Filled 2015-09-06 (×2): qty 1

## 2015-09-06 MED ORDER — ONDANSETRON HCL 4 MG/2ML IJ SOLN: 4.0000 mg | Freq: Three times a day (TID) | INTRAMUSCULAR | Status: DC | PRN

## 2015-09-06 MED ORDER — DEXTROSE 5 % IV SOLN
10.0000 mg/kg | Freq: Three times a day (TID) | INTRAVENOUS | Status: DC
  Filled 2015-09-06 (×2): qty 17.9

## 2015-09-06 MED ORDER — DOXYCYCLINE HYCLATE 100 MG PO TABS
100.0000 mg | ORAL_TABLET | Freq: Two times a day (BID) | ORAL | Status: DC
  Administered 2015-09-06 (×3): 100 mg via ORAL
  Filled 2015-09-06 (×3): qty 1

## 2015-09-06 MED ORDER — SODIUM CHLORIDE 0.9 % IV BOLUS (SEPSIS)
1500.0000 mL | Freq: Once | INTRAVENOUS | Status: AC
  Administered 2015-09-06: 1500 mL via INTRAVENOUS

## 2015-09-06 MED ORDER — IOHEXOL 300 MG/ML  SOLN
100.0000 mL | Freq: Once | INTRAMUSCULAR | Status: AC | PRN
  Administered 2015-09-06: 100 mL via INTRAVENOUS

## 2015-09-06 MED ORDER — ASPIRIN EC 81 MG PO TBEC
81.0000 mg | DELAYED_RELEASE_TABLET | Freq: Every day | ORAL | Status: DC
  Administered 2015-09-06 – 2015-09-08 (×3): 81 mg via ORAL
  Filled 2015-09-06 (×3): qty 1

## 2015-09-06 MED ORDER — HYDROCODONE-ACETAMINOPHEN 5-325 MG PO TABS
1.0000 | ORAL_TABLET | ORAL | Status: DC | PRN
  Administered 2015-09-06: 2 via ORAL
  Filled 2015-09-06 (×2): qty 2

## 2015-09-06 MED ORDER — ACETAMINOPHEN 650 MG RE SUPP
650.0000 mg | Freq: Four times a day (QID) | RECTAL | Status: DC | PRN
Start: 2015-09-06 — End: 2015-09-08

## 2015-09-06 MED ORDER — PRAVASTATIN SODIUM 20 MG PO TABS
20.0000 mg | ORAL_TABLET | Freq: Every day | ORAL | Status: DC
  Administered 2015-09-06 – 2015-09-08 (×3): 20 mg via ORAL
  Filled 2015-09-06 (×3): qty 1

## 2015-09-06 MED ORDER — MORPHINE SULFATE (PF) 2 MG/ML IV SOLN: 2.0000 mg | INTRAVENOUS | Status: DC | PRN

## 2015-09-06 MED ORDER — FAMOTIDINE IN NACL 20-0.9 MG/50ML-% IV SOLN
20.0000 mg | Freq: Two times a day (BID) | INTRAVENOUS | Status: DC
  Administered 2015-09-06 – 2015-09-07 (×5): 20 mg via INTRAVENOUS
  Filled 2015-09-06 (×7): qty 50

## 2015-09-06 MED ORDER — CALCIUM CARBONATE-VITAMIN D 500-200 MG-UNIT PO TABS
1.0000 | ORAL_TABLET | Freq: Every day | ORAL | Status: DC
  Administered 2015-09-06 – 2015-09-08 (×3): 1 via ORAL
  Filled 2015-09-06 (×3): qty 1

## 2015-09-06 MED ORDER — SODIUM CHLORIDE 0.9 % IJ SOLN
3.0000 mL | Freq: Two times a day (BID) | INTRAMUSCULAR | Status: DC
  Administered 2015-09-06: 3 mL via INTRAVENOUS

## 2015-09-06 MED ORDER — SODIUM CHLORIDE 0.9 % IV SOLN
INTRAVENOUS | Status: DC
  Administered 2015-09-06 – 2015-09-08 (×3): via INTRAVENOUS

## 2015-09-06 MED ORDER — DEXTROSE 5 % IV SOLN
2.0000 g | Freq: Two times a day (BID) | INTRAVENOUS | Status: DC
  Administered 2015-09-06 – 2015-09-07 (×2): 2 g via INTRAVENOUS
  Filled 2015-09-06 (×3): qty 2

## 2015-09-06 NOTE — Progress Notes (Signed)
PT Cancellation Note  Patient Details Name: [name] MRN: [id number] DOB: 06/03/1961   Cancelled Treatment:    Reason Eval/Treat Not Completed: PT screened, no needs identified, will sign off. Nursing reports no issues with mobility.   Janaiah Vetrano 09/06/2015, 4:41 PM  [employer] PT [phone]

## 2015-09-06 NOTE — ED Notes (Signed)
CBG 175. Nurse notified.

## 2015-09-06 NOTE — ED Notes (Signed)
Attempted report 

## 2015-09-06 NOTE — H&P (Signed)
[facility] History and Physical  Louis Joseph QQP:[id number] DOB: [birth year] DOA: 09/05/2015  Referring physician: ED physician PCP: Jerlyn Ly, MD  Specialists:   Chief Complaint: Fever, headache, nausea, vomiting, diarrhea  HPI: Louis Joseph is a 54 y.o. male with PMH of hyperlipidemia, diabetes mellitus on insulin pump, GERD, Gilbert's syndrome, PUD, hearing loss, who presents with fever, nausea, vomiting, diarrhea and headache.   Patient states that his symptoms began over Labor Day weekend while he was visiting in [place]. He had temperature of 103.1 today.  He has constant and global headache, but no neck stiffness or photophobia. No rashes. He also has whole-body aching and diffused mild joint pain. Almost at the same time, he started having nausea, vomiting, diarrhea, but no abdominal pain. No recent antibiotics use. He vomited 4-5 times yesterday without blood in the vomitus. He has had more than 10 time of bowel movements with loose stool yesterday. Patient does not have chest pain, cough, shortness of breath, unilateral weakness. He was seen by his PCP 2 days ago, who did RMSF titer(results not known yet), and put pt on doxycycline for suspected RMSF..  In ED, patient was found to have lactated 2.04, temperature 101.3, tachypnea, AKI, WBC 9.8.Marland Kitchen LP was performed in the ED, initial CSF analysis showed glucose 140 and protein 26. Patient is admitted to inpatient for further evaluation and treatment  Where does patient live?   At home    Can patient participate in ADLs?  Yes      Review of Systems:   General: has fevers, chills, no changes in body weight, has poor appetite, has fatigue HEENT: no blurry vision, hearing changes or sore throat Pulm: no dyspnea, coughing, wheezing CV: no chest pain, palpitations Abd: has nausea, vomiting,  diarrhea, no constipation and abdominal pain, GU: no dysuria, burning on urination, increased urinary frequency,  hematuria  Ext: no leg edema Neuro: no unilateral weakness, numbness, or tingling, no vision change or hearing loss Skin: no rash MSK: No muscle spasm, no deformity, no limitation of range of movement in spin Heme: No easy bruising.  Travel history: No recent long distant travel.  Allergy:  Allergies  Allergen Reactions  . Codeine Nausea And Vomiting    Past Medical History  Diagnosis Date  . Diabetes mellitus without complication   . Dyslipidemia 03/19/2015  . GERD (gastroesophageal reflux disease) 03/19/2015  . Gilbert's syndrome   . Osteopenia   . PUD (peptic ulcer disease)   . Colon adenomas   . Hearing loss     s/p hearing aides  . DM w/o complication type II 07/01/4579    Past Surgical History  Procedure Laterality Date  . Knee surgery    . Hand surgery    . Colonoscopy N/A 12/02/2014    Procedure: COLONOSCOPY;  Surgeon: Garlan Fair, MD;  Location: [facility];  Service: Endoscopy;  Laterality: N/A;    Social History:  reports that he has never smoked. He has never used smokeless tobacco. He reports that he does not drink alcohol or use illicit drugs.  Family History:  Family History  Problem Relation Age of Onset  . Diabetes Other   . Uterine cancer Mother   . Heart disease Father   . Diabetes Father   . Hypertension Father   . Hypertension Sister      Prior to Admission medications   Medication Sig Start Date End Date Taking? Authorizing Chyenne Sobczak  aspirin 81 MG tablet Take 81 mg by  mouth daily. Pt takes 1 tablet 4 times a week   Yes Historical Elvena Oyer, MD  CALCIUM-VITAMIN D PO Take 1 tablet by mouth 2 (two) times daily.   Yes Historical Avyn Coate, MD  doxycycline (VIBRA-TABS) 100 MG tablet Take 100 mg by mouth 2 (two) times daily. 09/04/15  Yes Historical Garnett Nunziata, MD  Insulin Human (INSULIN PUMP) 100 unit/ml SOLN Inject into the skin.   Yes Historical Kellan Raffield, MD  Lansoprazole (PREVACID 24HR PO) Take 1 capsule by mouth daily.    Yes Historical  Kelis Plasse, MD  metFORMIN (GLUCOPHAGE) 1000 MG tablet Take 1,000 mg by mouth 2 (two) times daily. 03/17/15  Yes Historical Sybrina Laning, MD  pantoprazole (PROTONIX) 40 MG tablet Take 40 mg by mouth 2 (two) times daily. 03/12/15  Yes Historical Terance Pomplun, MD  pravastatin (PRAVACHOL) 40 MG tablet Take 20 mg by mouth daily. Pt takes 1/2 tablet by mouth in the evening. 02/13/15  Yes Historical Danyale Ridinger, MD    Physical Exam: Filed Vitals:   09/06/15 0029 09/06/15 0110 09/06/15 0115 09/06/15 0222  BP:  130/72  120/70  Pulse:  85 85 72  Temp: 98.9 F (37.2 C)   98.6 F (37 C)  TempSrc: Oral   Oral  Resp:  18 17 20   Height:    5' 11"  (1.803 m)  Weight:    89.1 kg (196 lb 6.9 oz)  SpO2:  94% 96% 95%   General: Not in acute distress HEENT:       Eyes: PERRL, EOMI, no scleral icterus.       ENT: No discharge from the ears and nose, no pharynx injection, no tonsillar enlargement.        Neck: No JVD, no bruit, no mass felt. Heme: No neck lymph node enlargement. Cardiac: S1/S2, RRR, No murmurs, No gallops or rubs. Pulm: No rales, wheezing, rhonchi or rubs. Abd: Soft, nondistended, nontender, no rebound pain, no organomegaly, BS present. Ext: No pitting leg edema bilaterally. 2+DP/PT pulse bilaterally. Musculoskeletal: No joint deformities, No joint redness or warmth, no limitation of ROM in spin. Skin: No rashes.  Neuro: Alert, oriented X3, cranial nerves II-XII grossly intact, muscle strength 5/5 in all extremities, sensation to light touch intact. Brachial reflex 2+ bilaterally. Knee reflex 1+ bilaterally. Negative Babinski's sign. Normal finger to nose test. Negative Brudzinski sign and Kernig sign. Psych: Patient is not psychotic, no suicidal or hemocidal ideation.  Labs on Admission:  Basic Metabolic Panel:  Recent Labs Lab 09/05/15 1923  NA 131*  K 3.9  CL 99*  CO2 22  GLUCOSE 306*  BUN 13  CREATININE 1.30*  CALCIUM 9.3   Liver Function Tests:  Recent Labs Lab 09/05/15 1923  AST  38  ALT 31  ALKPHOS 82  BILITOT 1.8*  PROT 6.8  ALBUMIN 4.0   No results for input(s): LIPASE, AMYLASE in the last 168 hours. No results for input(s): AMMONIA in the last 168 hours. CBC:  Recent Labs Lab 09/05/15 1923  WBC 9.8  NEUTROABS 8.4*  HGB 15.8  HCT 43.3  MCV 87.3  PLT 180   Cardiac Enzymes: No results for input(s): CKTOTAL, CKMB, CKMBINDEX, TROPONINI in the last 168 hours.  BNP (last 3 results) No results for input(s): BNP in the last 8760 hours.  ProBNP (last 3 results) No results for input(s): PROBNP in the last 8760 hours.  CBG:  Recent Labs Lab 09/05/15 1859 09/06/15 0023  GLUCAP 309* 175*    Radiological Exams on Admission: Dg Chest 2 View  09/06/2015  CLINICAL DATA:  Possible [facility] spotted fever, feeling bad for 2 weeks with nausea, vomiting and diarrhea, headache. Hyperglycemia, history of diabetes.  EXAM: CHEST  2 VIEW  COMPARISON:  Chest radiograph Apr 29, 2008  FINDINGS: Cardiomediastinal silhouette is normal. The lungs are clear without pleural effusions or focal consolidations. Trachea projects midline and there is no pneumothorax. Soft tissue planes and included osseous structures are non-suspicious. Mild pectus excavatum.  IMPRESSION: No acute cardiopulmonary process.   Electronically Signed   By: Elon Alas M.D.   On: 09/06/2015 01:02    EKG:  Not done in ED, will get one.   Assessment/Plan Principal Problem:   Fever Active Problems:   Dyslipidemia   Gilbert's syndrome   Osteopenia   PUD (peptic ulcer disease)   Hearing loss   DM w/o complication type II   Nausea vomiting and diarrhea   Headache   Sepsis   AKI (acute kidney injury)   Fever and HA: ED physicians suspect meningitis, lumbar puncture was performed, initial analysis showed low protein and high glucose level, not consistent with bacterial meningitis. Patient does not have meningeal sign, I have low suspicious that patient has meningitis, though cannot  completely rule out viral meningitis.  - will admit to tele bed - continue Rocephin which was started by ED and pending CSF culture - start acyclovir IV - continue doxycycline - check RMSF IgM and IgG, B. Burdoffri titer - HSV PCR, RPR, ESR, blood culture, HIV ab, Echovirus ab.  - CSF cell count and Gram stain  Sepsis: Patient is septic with fever, elevated lactate and tachypnea. Hemodynamically stable. This is likely due to nausea, vomiting, diarrhea and suspected meningitis. - will get Procalcitonin and trend lactic acid level per sepsis protocol - IVF: received 2.5L of NS bolus in ED, followed by 100 cc/h - Neuro check frequently  Nausea, vomiting, diarrhea: Etiology is not clear. Likely due to viral gastroenteritis, but cannot rule out other possibilities, such as C. difficile colitis. -IVF as above -When necessary Zofran for nausea -Check lipase -c diff PCR and stool culture  GERD: -will switch PPI to pepcid IV until C diff pcr negative  HLD: Last LDL was not on record -Continue home medications: Pravastatin -Check FLP  DM-II: on insulin pump. Last A1c not on record. Patient is also taking metformin at home -will continue insulin pump -Check A1c -Consulate to diabetic educator for help in management insulin pump  AKI: Likely due to prerenal secondary to dehydration. - IVF as above - Check FeNa - US-renal - Follow up renal function by BMP -avoid NSIADs (Ed gave one dose of ibuprofen, will d/c)  DVT ppx: SCD (pt is s/p of LP, may start Sq Heparin in AM).  Code Status: Full code Family Communication:  Yes, patient's  wife at bed side Disposition Plan: Admit to inpatient   Date of Service 09/06/2015    Ivor Costa [facility] Pager [phone]  If 7PM-7AM, please contact night-coverage [email or web address] Password [email or web address] 09/06/2015, 2:29 AM

## 2015-09-06 NOTE — Progress Notes (Signed)
ANTIBIOTIC CONSULT NOTE - INITIAL  Pharmacy Consult for Acyclovir Indication: suspected meningitis  Allergies  Allergen Reactions  . Codeine Nausea And Vomiting    Patient Measurements: Height: 5\' 11"  (180.3 cm) Weight: 197 lb (89.359 kg) IBW/kg (Calculated) : 75.3 Adjusted Body Weight:   Vital Signs: Temp: 98.9 F (37.2 C) (09/17 0029) Temp Source: Oral (09/17 0029) BP: 115/59 mmHg (09/17 0000) Pulse Rate: 85 (09/17 0115) Intake/Output from previous day: 09/16 0701 - 09/17 0700 In: 1000 [I.V.:1000] Out: -  Intake/Output from this shift: Total I/O In: 1000 [I.V.:1000] Out: -   Labs:  Recent Labs  09/05/15 1923  WBC 9.8  HGB 15.8  PLT 180  CREATININE 1.30*   Estimated Creatinine Clearance: 68.4 mL/min (by C-G formula based on Cr of 1.3). No results for input(s): VANCOTROUGH, VANCOPEAK, VANCORANDOM, GENTTROUGH, GENTPEAK, GENTRANDOM, TOBRATROUGH, TOBRAPEAK, TOBRARND, AMIKACINPEAK, AMIKACINTROU, AMIKACIN in the last 72 hours.   Microbiology: Recent Results (from the past 720 hour(s))  CSF culture     Status: None (Preliminary result)   Collection Time: 09/05/15 11:21 PM  Result Value Ref Range Status   Specimen Description CSF  Final   Special Requests NONE  Final   Gram Stain   Final    CYTOSPIN SMEAR WBC PRESENT, PREDOMINANTLY MONONUCLEAR NO ORGANISMS SEEN    Culture PENDING  Incomplete   Report Status PENDING  Incomplete    Medical History: Past Medical History  Diagnosis Date  . Diabetes mellitus without complication   . Dyslipidemia 03/19/2015  . GERD (gastroesophageal reflux disease) 03/19/2015  . Gilbert's syndrome   . Osteopenia   . PUD (peptic ulcer disease)   . Colon adenomas   . Hearing loss     s/p hearing aides  . DM w/o complication type II 06/02/4314    Medications:   (Not in a hospital admission) Scheduled:  . aspirin  81 mg Oral Daily  . calcium-vitamin D  2 tablet Oral Daily  . doxycycline  100 mg Oral BID  . insulin pump    Subcutaneous TID AC, HS, 0200  . pravastatin  20 mg Oral Daily   Infusions:  . sodium chloride    . cefTRIAXone (ROCEPHIN) IVPB 1 gram/50 mL D5W    . famotidine (PEPCID) IV    . sodium chloride     Assessment: 54yo male with history of DM2, HLD, GERD, and PUD presents with 2 weeks of fever, HA, and N/V/D. Pharmacy is consulted to dose acyclovir for suspected meningitis. Tmax 101.3, WBC 9.8, sCr 1.3.  Goal of Therapy:  eradication of infection  Plan:  Acyclovir 10mg /kg IV q8h Follow up culture results, renal function and clinical course  Andrey Cota. Diona Foley, PharmD Clinical Pharmacist Pager [phone] 09/06/2015,1:21 AM

## 2015-09-06 NOTE — Progress Notes (Signed)
[facility] PROGRESS NOTE  Louis Joseph ERX:[id number] DOB: 1961/04/21 DOA: 09/05/2015 PCP: Jerlyn Ly, MD  Assessment/Plan: Fever and HA: Lumbar puncture was performed, initial analysis showed low protein and high glucose level, not consistent with bacterial meningitis. Patient does not have meningeal sign, Low suspicious that patient has meningitis - continue Rocephin which was started by ED and pending CSF culture - on empiric acyclovir IV - continued doxycycline - check RMSF IgM and IgG, B. Burdoffri titer - pending - HSV PCR, RPR, ESR, blood culture, HIV ab, Echovirus ab - pending - CSF cell count and Gram stain - pending  Sepsis: Patient is septic with fever, elevated lactate and tachypnea. Unclear etiology Hemodynamically stable. - IVF: received 2.5L of NS bolus in ED, followed by 100 cc/h - Lactate normalized  Nausea, vomiting, diarrhea: Etiology is not clear. Likely due to viral gastroenteritis, but cannot rule out other possibilities, such as C. difficile colitis. -IVF as above -Cont PRN Zofran for nausea -Lipase normal -cdiff neg -CT abd ordered, pending  GERD: -on IV pepcid  HLD: Last LDL was not on record -Continue home medications: Pravastatin -Check FLP  DM-II: on insulin pump. Last A1c not on record. Patient is also taking metformin at home -will continue insulin pump -Check A1c -Consulate to diabetic educator for help in management insulin pump  AKI: Likely due to prerenal secondary to dehydration. - IVF as above - Check FeNa - US-renal - Follow up renal function by BMP -avoid NSIADs (Ed gave one dose of ibuprofen, will d/c)  DVT ppx: SCD's   Code Status: Full Family Communication: Pt in room (indicate person spoken with, relationship, and if by phone, the number) Disposition Plan: Pending   Consultants:    Procedures:  LP in ED  Antibiotics:  Acyclovir 9/17>>>  Rocephin 9/17>>>  Doxycycline  9/17>>>  HPI/Subjective: Reports generalized malaise, nausea  Objective: Filed Vitals:   09/06/15 0110 09/06/15 0115 09/06/15 0222 09/06/15 0530  BP: 130/72  120/70 137/69  Pulse: 85 85 72 76  Temp:   98.6 F (37 C) 98.9 F (37.2 C)  TempSrc:   Oral Oral  Resp: 18 17 20 20   Height:   5' 11"  (1.803 m)   Weight:   89.1 kg (196 lb 6.9 oz)   SpO2: 94% 96% 95% 96%    Intake/Output Summary (Last 24 hours) at 09/06/15 1739 Last data filed at 09/06/15 1719  Gross per 24 hour  Intake   1200 ml  Output    500 ml  Net    700 ml   Filed Weights   09/05/15 1856 09/06/15 0222  Weight: 89.359 kg (197 lb) 89.1 kg (196 lb 6.9 oz)    Exam:   General:  Awake, in nad  Cardiovascular: regular, s1, s2  Respiratory: normal resp effort, no wheezing  Abdomen: soft,nondistended  Musculoskeletal: perfused, no clubbing   Data Reviewed: Basic Metabolic Panel:  Recent Labs Lab 09/05/15 1923 09/06/15 0358  NA 131* 134*  K 3.9 3.8  CL 99* 105  CO2 22 22  GLUCOSE 306* 216*  BUN 13 12  CREATININE 1.30* 1.10  CALCIUM 9.3 7.6*   Liver Function Tests:  Recent Labs Lab 09/05/15 1923  AST 38  ALT 31  ALKPHOS 82  BILITOT 1.8*  PROT 6.8  ALBUMIN 4.0    Recent Labs Lab 09/06/15 0155  LIPASE 17*   No results for input(s): AMMONIA in the last 168 hours. CBC:  Recent Labs Lab 09/05/15 1923 09/06/15 0358  WBC 9.8 5.8  NEUTROABS 8.4*  --   HGB 15.8 12.8*  HCT 43.3 35.6*  MCV 87.3 87.3  PLT 180 139*   Cardiac Enzymes: No results for input(s): CKTOTAL, CKMB, CKMBINDEX, TROPONINI in the last 168 hours. BNP (last 3 results) No results for input(s): BNP in the last 8760 hours.  ProBNP (last 3 results) No results for input(s): PROBNP in the last 8760 hours.  CBG:  Recent Labs Lab 09/05/15 1859 09/06/15 0023 09/06/15 0239 09/06/15 0803  GLUCAP 309* 175* 230* 189*    Recent Results (from the past 240 hour(s))  CSF culture     Status: None (Preliminary result)    Collection Time: 09/05/15 11:21 PM  Result Value Ref Range Status   Specimen Description CSF  Final   Special Requests NONE  Final   Gram Stain   Final    CYTOSPIN SMEAR WBC PRESENT, PREDOMINANTLY MONONUCLEAR NO ORGANISMS SEEN    Culture PENDING  Incomplete   Report Status PENDING  Incomplete  C difficile quick scan w PCR reflex     Status: None   Collection Time: 09/06/15  3:46 AM  Result Value Ref Range Status   C Diff antigen NEGATIVE NEGATIVE Final   C Diff toxin NEGATIVE NEGATIVE Final   C Diff interpretation Negative for toxigenic C. difficile  Final     Studies: Dg Chest 2 View  09/06/2015   CLINICAL DATA:  Possible [place] spotted fever, feeling bad for 2 weeks with nausea, vomiting and diarrhea, headache. Hyperglycemia, history of diabetes.  EXAM: CHEST  2 VIEW  COMPARISON:  Chest radiograph Apr 29, 2008  FINDINGS: Cardiomediastinal silhouette is normal. The lungs are clear without pleural effusions or focal consolidations. Trachea projects midline and there is no pneumothorax. Soft tissue planes and included osseous structures are non-suspicious. Mild pectus excavatum.  IMPRESSION: No acute cardiopulmonary process.   Electronically Signed   By: Elon Alas M.D.   On: 09/06/2015 01:02   US Renal  09/06/2015   CLINICAL DATA:  Acute kidney injury.  EXAM: RENAL / URINARY TRACT ULTRASOUND COMPLETE  COMPARISON:  None.  FINDINGS: Right Kidney:  Length: 12.1 cm. Dilatation of the renal pelvis and major calices consistent with hydronephrosis. Echogenicity within normal limits. No mass visualized.  Left Kidney:  Length: 12.6 cm. Echogenicity within normal limits. No mass or hydronephrosis visualized.  Bladder:  Appears normal for degree of bladder distention.  IMPRESSION: 1. Mild right hydronephrosis. 2. Normal appearance of the left kidney and bladder.   Electronically Signed   By: Jeb Levering M.D.   On: 09/06/2015 04:57    Scheduled Meds: . acyclovir  750 mg  Intravenous Q8H  . aspirin EC  81 mg Oral Daily  . calcium-vitamin D  1 tablet Oral Q breakfast  . cefTRIAXone (ROCEPHIN)  IV  1 g Intravenous STAT   Followed by  . cefTRIAXone (ROCEPHIN)  IV  2 g Intravenous Q12H  . doxycycline  100 mg Oral BID  . famotidine (PEPCID) IV  20 mg Intravenous Q12H  . insulin pump   Subcutaneous TID AC, HS, 0200  . pravastatin  20 mg Oral Daily  . sodium chloride  3 mL Intravenous Q12H   Continuous Infusions: . sodium chloride 75 mL/hr at 09/06/15 0355    Principal Problem:   Fever Active Problems:   Dyslipidemia   Gilbert's syndrome   Osteopenia   PUD (peptic ulcer disease)   Hearing loss   DM w/o complication type II  Nausea vomiting and diarrhea   Headache   Sepsis   AKI (acute kidney injury)   CH[facility], [facility] Pager [phone]. If 7PM-7AM, please contact night-coverage at [email or web address], password [email or web address] 09/06/2015, 5:39 PM  LOS: 0 days

## 2015-09-07 DIAGNOSIS — M8580 Other specified disorders of bone density and structure, unspecified site: Secondary | ICD-10-CM

## 2015-09-07 DIAGNOSIS — K279 Peptic ulcer, site unspecified, unspecified as acute or chronic, without hemorrhage or perforation: Secondary | ICD-10-CM

## 2015-09-07 DIAGNOSIS — R197 Diarrhea, unspecified: Secondary | ICD-10-CM

## 2015-09-07 DIAGNOSIS — R112 Nausea with vomiting, unspecified: Secondary | ICD-10-CM

## 2015-09-07 LAB — RAPID URINE DRUG SCREEN, HOSP PERFORMED
Amphetamines: NOT DETECTED
Barbiturates: NOT DETECTED
Benzodiazepines: NOT DETECTED
Cocaine: NOT DETECTED
Opiates: NOT DETECTED
Tetrahydrocannabinol: NOT DETECTED

## 2015-09-07 LAB — BASIC METABOLIC PANEL
Anion gap: 6 (ref 5–15)
BUN: <5 mg/dL — ABNORMAL LOW (ref 6–20)
CHLORIDE: 104 mmol/L (ref 101–111)
CO2: 27 mmol/L (ref 22–32)
CREATININE: 1.13 mg/dL (ref 0.61–1.24)
Calcium: 8.2 mg/dL — ABNORMAL LOW (ref 8.9–10.3)
Glucose, Bld: 255 mg/dL — ABNORMAL HIGH (ref 65–99)
POTASSIUM: 3.5 mmol/L (ref 3.5–5.1)
SODIUM: 137 mmol/L (ref 135–145)

## 2015-09-07 LAB — CBC
HCT: 34.8 % — ABNORMAL LOW (ref 39.0–52.0)
Hemoglobin: 12.6 g/dL — ABNORMAL LOW (ref 13.0–17.0)
MCH: 31.5 pg (ref 26.0–34.0)
MCHC: 36.2 g/dL — ABNORMAL HIGH (ref 30.0–36.0)
MCV: 87 fL (ref 78.0–100.0)
PLATELETS: 148 10*3/uL — AB (ref 150–400)
RBC: 4 MIL/uL — AB (ref 4.22–5.81)
RDW: 11.8 % (ref 11.5–15.5)
WBC: 4.4 10*3/uL (ref 4.0–10.5)

## 2015-09-07 LAB — CREATININE, URINE, RANDOM: CREATININE, URINE: 59.88 mg/dL

## 2015-09-07 LAB — SODIUM, URINE, RANDOM: Sodium, Ur: 46 mmol/L

## 2015-09-07 LAB — GLUCOSE, CAPILLARY: Glucose-Capillary: 204 mg/dL — ABNORMAL HIGH (ref 65–99)

## 2015-09-07 LAB — RPR: RPR Ser Ql: NONREACTIVE

## 2015-09-07 MED ORDER — CIPROFLOXACIN HCL 500 MG PO TABS
500.0000 mg | ORAL_TABLET | Freq: Two times a day (BID) | ORAL | Status: DC
  Administered 2015-09-07 – 2015-09-08 (×3): 500 mg via ORAL
  Filled 2015-09-07 (×3): qty 1

## 2015-09-07 MED ORDER — SACCHAROMYCES BOULARDII 250 MG PO CAPS
250.0000 mg | ORAL_CAPSULE | Freq: Two times a day (BID) | ORAL | Status: DC
  Administered 2015-09-07 – 2015-09-08 (×3): 250 mg via ORAL
  Filled 2015-09-07 (×3): qty 1

## 2015-09-07 MED ORDER — INSULIN PUMP
Freq: Three times a day (TID) | SUBCUTANEOUS | Status: DC
  Administered 2015-09-07: 3.4 via SUBCUTANEOUS
  Administered 2015-09-08: 2.4 via SUBCUTANEOUS
  Filled 2015-09-07: qty 1

## 2015-09-07 MED ORDER — METRONIDAZOLE IN NACL 5-0.79 MG/ML-% IV SOLN
500.0000 mg | Freq: Three times a day (TID) | INTRAVENOUS | Status: DC
  Administered 2015-09-07 – 2015-09-08 (×4): 500 mg via INTRAVENOUS
  Filled 2015-09-07 (×4): qty 100

## 2015-09-07 NOTE — Progress Notes (Signed)
[facility] PROGRESS NOTE  Louis Joseph [id number]:[id number] DOB: June 16, 1961 DOA: 09/05/2015 PCP: Jerlyn Ly, MD  Assessment/Plan: Fever and HA: Lumbar puncture was performed, initial analysis showed low protein and high glucose level, not consistent with bacterial meningitis. Patient does not have meningeal sign, Low suspicious that patient has meningitis - Patient was initially continued on Rocephin and acyclovir which was started by ED  - also continued doxycycline - check RMSF IgM and IgG, B. Burdoffri titer - still pending - CSF culture neg - HSV PCR,  - RPR NR, ESR neg, blood culture neg x 2, HIV NR,  - Echovirus ab - pending  Sepsis: Patient is septic with fever, elevated lactate and tachypnea. Unclear etiology Hemodynamically stable. - IVF: received 2.5L of NS bolus in ED, followed by 100 cc/h - Lactate normalized  Nausea, vomiting, diarrhea secondary to colitis:  -Reports feeling much improved -IVF as above -Cont PRN Zofran for nausea -Lipase normal -cdiff neg -CT abd with findings of multifocal thickening of colonic wall in descending colon and sigmoid colon c/w diffuse colitis - Antibiotics since transitioned to ciprofloxacin and flagyl  GERD: -on IV pepcid  HLD: Last LDL was not on record -Continue home medications: Pravastatin -Check FLP  DM-II: on insulin pump. Last A1c not on record. Patient is also taking metformin at home -will continue insulin pump -Check A1c -Consulate to diabetic educator for help in management insulin pump  AKI: Likely due to prerenal secondary to dehydration. - IVF as above - Check FeNa - US-renal - Follow up renal function by BMP -avoid NSIADs (Ed gave one dose of ibuprofen, will d/c)  DVT ppx: SCD's   Code Status: Full Family Communication: Pt in room, family at bedside Disposition Plan: Pending  Consultants:    Procedures:  LP in ED  Antibiotics:  Acyclovir 9/17>>>9/18  Rocephin  9/17>>>9/18  Doxycycline 9/17>>>9/18  Ciprofloxacin 9/18>>>  Flagyl 9/8>>>  HPI/Subjective: Reports generalized malaise, nausea  Objective: Filed Vitals:   09/06/15 0530 09/06/15 2132 09/07/15 0528 09/07/15 1452  BP: 137/69 126/61 121/71 120/59  Pulse: 76 93 72 71  Temp: 98.9 F (37.2 C) 100.6 F (38.1 C) 99.2 F (37.3 C) 98.3 F (36.8 C)  TempSrc: Oral Oral Oral Oral  Resp: 20 20 16 19   Height:      Weight:      SpO2: 96% 96% 93% 95%    Intake/Output Summary (Last 24 hours) at 09/07/15 1530 Last data filed at 09/07/15 0901  Gross per 24 hour  Intake      0 ml  Output    200 ml  Net   -200 ml   Filed Weights   09/05/15 1856 09/06/15 0222  Weight: 89.359 kg (197 lb) 89.1 kg (196 lb 6.9 oz)    Exam:   General:  Awake, in nad  Cardiovascular: regular, s1, s2  Respiratory: normal resp effort, no wheezing  Abdomen: soft,nondistended  Musculoskeletal: perfused, no clubbing, no cyanosis  Data Reviewed: Basic Metabolic Panel:  Recent Labs Lab 09/05/15 1923 09/06/15 0358 09/07/15 0521  NA 131* 134* 137  K 3.9 3.8 3.5  CL 99* 105 104  CO2 22 22 27   GLUCOSE 306* 216* 255*  BUN 13 12 <5*  CREATININE 1.30* 1.10 1.13  CALCIUM 9.3 7.6* 8.2*   Liver Function Tests:  Recent Labs Lab 09/05/15 1923  AST 38  ALT 31  ALKPHOS 82  BILITOT 1.8*  PROT 6.8  ALBUMIN 4.0    Recent Labs Lab 09/06/15 0155  LIPASE  17*   No results for input(s): AMMONIA in the last 168 hours. CBC:  Recent Labs Lab 09/05/15 1923 09/06/15 0358 09/07/15 0521  WBC 9.8 5.8 4.4  NEUTROABS 8.4*  --   --   HGB 15.8 12.8* 12.6*  HCT 43.3 35.6* 34.8*  MCV 87.3 87.3 87.0  PLT 180 139* 148*   Cardiac Enzymes: No results for input(s): CKTOTAL, CKMB, CKMBINDEX, TROPONINI in the last 168 hours. BNP (last 3 results) No results for input(s): BNP in the last 8760 hours.  ProBNP (last 3 results) No results for input(s): PROBNP in the last 8760 hours.  CBG:  Recent  Labs Lab 09/06/15 0023 09/06/15 0239 09/06/15 0803 09/06/15 1918 09/07/15 0803  GLUCAP 175* 230* 189* 211* 204*    Recent Results (from the past 240 hour(s))  Blood culture (routine x 2)     Status: None (Preliminary result)   Collection Time: 09/05/15  3:30 AM  Result Value Ref Range Status   Specimen Description BLOOD LEFT ARM  Final   Special Requests BOTTLES DRAWN AEROBIC AND ANAEROBIC 10CC  Final   Culture NO GROWTH 1 DAY  Final   Report Status PENDING  Incomplete  CSF culture     Status: None (Preliminary result)   Collection Time: 09/05/15 11:21 PM  Result Value Ref Range Status   Specimen Description CSF  Final   Special Requests NONE  Final   Gram Stain   Final    CYTOSPIN SMEAR WBC PRESENT, PREDOMINANTLY MONONUCLEAR NO ORGANISMS SEEN    Culture NO GROWTH 1 DAY  Final   Report Status PENDING  Incomplete  Blood culture (routine x 2)     Status: None (Preliminary result)   Collection Time: 09/06/15  1:40 AM  Result Value Ref Range Status   Specimen Description BLOOD RIGHT HAND  Final   Special Requests BOTTLES DRAWN AEROBIC AND ANAEROBIC 10CC   Final   Culture NO GROWTH 1 DAY  Final   Report Status PENDING  Incomplete  C difficile quick scan w PCR reflex     Status: None   Collection Time: 09/06/15  3:46 AM  Result Value Ref Range Status   C Diff antigen NEGATIVE NEGATIVE Final   C Diff toxin NEGATIVE NEGATIVE Final   C Diff interpretation Negative for toxigenic C. difficile  Final     Studies: Dg Chest 2 View  09/06/2015   CLINICAL DATA:  Possible [place] spotted fever, feeling bad for 2 weeks with nausea, vomiting and diarrhea, headache. Hyperglycemia, history of diabetes.  EXAM: CHEST  2 VIEW  COMPARISON:  Chest radiograph Apr 29, 2008  FINDINGS: Cardiomediastinal silhouette is normal. The lungs are clear without pleural effusions or focal consolidations. Trachea projects midline and there is no pneumothorax. Soft tissue planes and included osseous  structures are non-suspicious. Mild pectus excavatum.  IMPRESSION: No acute cardiopulmonary process.   Electronically Signed   By: Elon Alas M.D.   On: 09/06/2015 01:02   Ct Abdomen Pelvis W Contrast  09/06/2015   CLINICAL DATA:  Nausea, vomiting, diarrhea starting Labor day, sepsis  EXAM: CT ABDOMEN AND PELVIS WITH CONTRAST  TECHNIQUE: Multidetector CT imaging of the abdomen and pelvis was performed using the standard protocol following bolus administration of intravenous contrast.  CONTRAST:  168m OMNIPAQUE IOHEXOL 300 MG/ML SOLN, 1 OMNIPAQUE IOHEXOL 300 MG/ML SOLN  COMPARISON:  None.  FINDINGS: Lung bases shows linear atelectasis or scarring in right lower lobe. Sagittal images of the spine shows mild degenerative changes  thoracolumbar spine. Liver, spleen, pancreas and adrenal glands are unremarkable. No calcified gallstones are noted within gallbladder. Kidneys shows a lobulated contour. Mild malrotation of the right kidney. No hydronephrosis or hydroureter. Delayed renal images shows bilateral renal symmetrical excretion. Bilateral visualized proximal ureter is unremarkable. No aortic aneurysm. Normal appendix. No pericecal inflammation. There is mild thickening of colonic wall in right colon. Thickening of colonic wall is noted in hepatic flexure of the colon. There is thickening of colonic wall in distal transverse colon. Significant thickening of the colonic wall in descending colon and sigmoid colon. Mild stranding of pericolonic fat in descending colon and sigmoid colon. Small amount of free fluid noted within posterior pelvis. Findings are consistent with diffuse colitis. Infectious colitis cannot be excluded. Clinical correlation is necessary. There is no evidence of pericolonic abscess. No small bowel obstruction. No free abdominal air. Prostate gland measures 4.9 by 3.5 cm. There is no inguinal adenopathy. Urinary bladder is unremarkable.  Small nonspecific lymph nodes are noted in right  lower quadrant mesentery the largest in axial image 68 measures 8 mm.  IMPRESSION: 1. There is multifocal thickening of colonic wall. Significant thickening of colonic wall in descending colon and sigmoid colon. Findings are consistent with diffuse colitis. Infectious colitis cannot be excluded. Clinical correlation is necessary. 2. Small amount of free fluid noted within posterior pelvis. 3. No hydronephrosis or hydroureter. 4. Normal appendix.  No pericecal inflammation. 5. No small bowel obstruction.   Electronically Signed   By: Lahoma Crocker M.D.   On: 09/06/2015 19:38   US Renal  09/06/2015   CLINICAL DATA:  Acute kidney injury.  EXAM: RENAL / URINARY TRACT ULTRASOUND COMPLETE  COMPARISON:  None.  FINDINGS: Right Kidney:  Length: 12.1 cm. Dilatation of the renal pelvis and major calices consistent with hydronephrosis. Echogenicity within normal limits. No mass visualized.  Left Kidney:  Length: 12.6 cm. Echogenicity within normal limits. No mass or hydronephrosis visualized.  Bladder:  Appears normal for degree of bladder distention.  IMPRESSION: 1. Mild right hydronephrosis. 2. Normal appearance of the left kidney and bladder.   Electronically Signed   By: Jeb Levering M.D.   On: 09/06/2015 04:57    Scheduled Meds: . aspirin EC  81 mg Oral Daily  . calcium-vitamin D  1 tablet Oral Q breakfast  . ciprofloxacin  500 mg Oral BID  . famotidine (PEPCID) IV  20 mg Intravenous Q12H  . insulin pump   Subcutaneous TID AC, HS, 0200  . insulin pump   Subcutaneous TID AC, HS, 0200  . metronidazole  500 mg Intravenous 3 times per day  . pravastatin  20 mg Oral Daily  . saccharomyces boulardii  250 mg Oral BID  . sodium chloride  3 mL Intravenous Q12H   Continuous Infusions: . sodium chloride 75 mL/hr at 09/07/15 2820    Principal Problem:   Fever Active Problems:   Dyslipidemia   Gilbert's syndrome   Osteopenia   PUD (peptic ulcer disease)   Hearing loss   DM w/o complication type II    Nausea vomiting and diarrhea   Headache   Sepsis   AKI (acute kidney injury)   Bad headache   [facility], [facility] Pager [phone]. If 7PM-7AM, please contact night-coverage at [email or web address], password [facility] 09/07/2015, 3:30 PM  LOS: 1 day

## 2015-09-07 NOTE — Progress Notes (Signed)
ANTIBIOTIC CONSULT NOTE - INITIAL  Pharmacy Consult for ciprofloxacin Indication: Intra-abdominal infection  Allergies  Allergen Reactions  . Codeine Nausea And Vomiting    Patient Measurements: Height: 5\' 11"  (180.3 cm) Weight: 196 lb 6.9 oz (89.1 kg) IBW/kg (Calculated) : 75.3  Vital Signs: Temp: 99.2 F (37.3 C) (09/18 0528) Temp Source: Oral (09/18 0528) BP: 121/71 mmHg (09/18 0528) Pulse Rate: 72 (09/18 0528) Intake/Output from previous day: 09/17 0701 - 09/18 0700 In: 200 [P.O.:200] Out: 500 [Urine:500] Intake/Output from this shift:    Labs:  Recent Labs  09/05/15 1923 09/06/15 0358 09/07/15 0521  WBC 9.8 5.8 4.4  HGB 15.8 12.8* 12.6*  PLT 180 139* 148*  CREATININE 1.30* 1.10 1.13   Estimated Creatinine Clearance: 78.7 mL/min (by C-G formula based on Cr of 1.13). No results for input(s): VANCOTROUGH, VANCOPEAK, VANCORANDOM, GENTTROUGH, GENTPEAK, GENTRANDOM, TOBRATROUGH, TOBRAPEAK, TOBRARND, AMIKACINPEAK, AMIKACINTROU, AMIKACIN in the last 72 hours.   Microbiology: Recent Results (from the past 720 hour(s))  CSF culture     Status: None (Preliminary result)   Collection Time: 09/05/15 11:21 PM  Result Value Ref Range Status   Specimen Description CSF  Final   Special Requests NONE  Final   Gram Stain   Final    CYTOSPIN SMEAR WBC PRESENT, PREDOMINANTLY MONONUCLEAR NO ORGANISMS SEEN    Culture PENDING  Incomplete   Report Status PENDING  Incomplete  C difficile quick scan w PCR reflex     Status: None   Collection Time: 09/06/15  3:46 AM  Result Value Ref Range Status   C Diff antigen NEGATIVE NEGATIVE Final   C Diff toxin NEGATIVE NEGATIVE Final   C Diff interpretation Negative for toxigenic C. difficile  Final    Medical History: Past Medical History  Diagnosis Date  . Diabetes mellitus without complication   . Dyslipidemia 03/19/2015  . GERD (gastroesophageal reflux disease) 03/19/2015  . Gilbert's syndrome   . Osteopenia   . PUD (peptic  ulcer disease)   . Colon adenomas   . Hearing loss     s/p hearing aides  . DM w/o complication type II 4/62/7035   Assessment:  54yo male with history of DM2, HLD, GERD, and PUD presents with 2 weeks of fever, HA, and N/V/D initially on acyclovir for r/o meningitis but has now been discontinued. Pharmacy consulted to dose ciprofloxacin for intra-abdominal infection. Afeb, WBC 4.4, CrCl ~80 mL/min.  Goal of Therapy:  Eradicate infection  Plan:  Ciprofloxacin po 500 mg q 12 hrs F/u cultures, renal function, and clinical course, LOT.   Angela Burke, PharmD Pharmacy Resident Pager: [phone] 09/07/2015,7:57 AM

## 2015-09-08 DIAGNOSIS — K529 Noninfective gastroenteritis and colitis, unspecified: Secondary | ICD-10-CM

## 2015-09-08 LAB — COMPREHENSIVE METABOLIC PANEL
ALBUMIN: 2.9 g/dL — AB (ref 3.5–5.0)
ALK PHOS: 58 U/L (ref 38–126)
ALT: 31 U/L (ref 17–63)
ANION GAP: 6 (ref 5–15)
AST: 34 U/L (ref 15–41)
BILIRUBIN TOTAL: 1.1 mg/dL (ref 0.3–1.2)
CALCIUM: 8.2 mg/dL — AB (ref 8.9–10.3)
CO2: 28 mmol/L (ref 22–32)
Chloride: 107 mmol/L (ref 101–111)
Creatinine, Ser: 0.9 mg/dL (ref 0.61–1.24)
GFR calc Af Amer: >60 mL/min (ref >60)
GFR calc non Af Amer: >60 mL/min (ref >60)
GLUCOSE: 112 mg/dL — AB (ref 65–99)
Potassium: 3.1 mmol/L — ABNORMAL LOW (ref 3.5–5.1)
SODIUM: 141 mmol/L (ref 135–145)
Total Protein: 4.8 g/dL — ABNORMAL LOW (ref 6.5–8.1)

## 2015-09-08 LAB — GLUCOSE, CAPILLARY
GLUCOSE-CAPILLARY: 111 mg/dL — AB (ref 65–99)
GLUCOSE-CAPILLARY: 209 mg/dL — AB (ref 65–99)
Glucose-Capillary: 123 mg/dL — ABNORMAL HIGH (ref 65–99)

## 2015-09-08 LAB — CBC
HEMATOCRIT: 33.1 % — AB (ref 39.0–52.0)
HEMOGLOBIN: 12.2 g/dL — AB (ref 13.0–17.0)
MCH: 31.7 pg (ref 26.0–34.0)
MCHC: 36.9 g/dL — AB (ref 30.0–36.0)
MCV: 86 fL (ref 78.0–100.0)
Platelets: 135 10*3/uL — ABNORMAL LOW (ref 150–400)
RBC: 3.85 MIL/uL — ABNORMAL LOW (ref 4.22–5.81)
RDW: 12 % (ref 11.5–15.5)
WBC: 4.7 10*3/uL (ref 4.0–10.5)

## 2015-09-08 LAB — B. BURGDORFI ANTIBODIES: B burgdorferi Ab IgG+IgM: <0.91 {ISR} (ref 0.00–0.90)

## 2015-09-08 LAB — HEMOGLOBIN A1C
HEMOGLOBIN A1C: 7.3 % — AB (ref 4.8–5.6)
MEAN PLASMA GLUCOSE: 163 mg/dL

## 2015-09-08 MED ORDER — FAMOTIDINE 20 MG PO TABS
20.0000 mg | ORAL_TABLET | Freq: Two times a day (BID) | ORAL | Status: DC
  Administered 2015-09-08: 20 mg via ORAL
  Filled 2015-09-08: qty 1

## 2015-09-08 MED ORDER — POTASSIUM CHLORIDE CRYS ER 20 MEQ PO TBCR
40.0000 meq | EXTENDED_RELEASE_TABLET | Freq: Once | ORAL | Status: AC
  Administered 2015-09-08: 40 meq via ORAL
  Filled 2015-09-08: qty 2

## 2015-09-08 MED ORDER — METRONIDAZOLE 500 MG PO TABS: 500.0000 mg | ORAL_TABLET | Freq: Three times a day (TID) | ORAL | Status: DC

## 2015-09-08 MED ORDER — CIPROFLOXACIN HCL 500 MG PO TABS: 500.0000 mg | ORAL_TABLET | Freq: Two times a day (BID) | ORAL | Status: DC

## 2015-09-08 MED ORDER — SACCHAROMYCES BOULARDII 250 MG PO CAPS: 250.0000 mg | ORAL_CAPSULE | Freq: Two times a day (BID) | ORAL | Status: AC

## 2015-09-08 MED ORDER — HYDROCODONE-ACETAMINOPHEN 5-325 MG PO TABS: 1.0000 | ORAL_TABLET | ORAL | Status: DC | PRN

## 2015-09-08 NOTE — Care Management Note (Signed)
Case Management Note  Patient Details  Name: Louis Joseph MRN: [id number] Date of Birth: 11/16/1961  Subjective/Objective:                  Date: 09-07-18 Monday Spoke with patient at the bedside along with wife Louis Joseph. Introduced self as Tourist information centre manager and explained role in discharge planning and how to be reached. Verified patient lives in [place] in a two story house, with spouse, has DME cane walker but denies needing to use them. Expressed potential need for no other DME. Verified patient anticipates to go home with spouse at time of discharge and will have part-time supervision by wife at this time to best of their knowledge. Patient denied needing help with their medication. Patient drives to MD appointments. Verified patient has PCP.   Plan: NO HH needs identified at this time.  Carles Collet RN BSN CM [phone]   Action/Plan:   Expected Discharge Date:                  Expected Discharge Plan:  Home/Self Care  In-House Referral:     Discharge planning Services  CM Consult  Post Acute Care Choice:    Choice offered to:     DME Arranged:    DME Agency:     HH Arranged:    [place] Agency:     Status of Service:  Completed, signed off  Medicare Important Message Given:    Date Medicare IM Given:    Medicare IM give by:    Date Additional Medicare IM Given:    Additional Medicare Important Message give by:     If discussed at [place] of Stay Meetings, dates discussed:    Additional Comments:  Carles Collet, RN 09/08/2015, 11:13 AM

## 2015-09-08 NOTE — Discharge Summary (Addendum)
Physician Discharge Summary  Louis Joseph TAV:[id number] DOB: 25-Feb-1961 DOA: 09/05/2015  PCP: Jerlyn Ly, MD  Admit date: 09/05/2015 Discharge date: 09/08/2015  Time spent: 20 minutes  Recommendations for Outpatient Follow-up:  1. Follow up with PCP as scheduled 2. Please note, pt is being discharged with cipro/flagyl alone for colitis 3. Please follow up on: 1. RMSF titers 2. Lyme disease titers 3. HSV and echoovirus serologies (send out labs)  Discharge Diagnoses:  Principal Problem:   Acute colitis Active Problems:   Dyslipidemia   Gilbert's syndrome   Osteopenia   PUD (peptic ulcer disease)   Hearing loss   DM w/o complication type II   Fever   Nausea vomiting and diarrhea   Headache   Sepsis   AKI (acute kidney injury)   Bad headache   Discharge Condition: Imroved  Diet recommendation: Regular  Filed Weights   09/05/15 1856 09/06/15 0222  Weight: 89.359 kg (197 lb) 89.1 kg (196 lb 6.9 oz)    History of present illness:  Please see admit h and p from 9/17 for details. Briefly, pt presented with fevers, headache, nausea and vomiting. Patient was noted to have temp of 101.76F with elevated lactic acid. The patient was admitted for further work up.  Hospital Course:  Fever and HA: Lumbar puncture was performed in the ED, initial analysis showed low protein and high glucose level, not consistent with bacterial meningitis. Patient does not have meningeal sign, Low suspicious that patient has meningitis - Patient was initially continued on Rocephin and acyclovir which was started by the ED  - Patient was also initially continued on doxycycline - RMSF IgM and IgG, B. Burdoffri titer - still pending at time of discharge - CSF culture without growth - HSV PCR pending - RPR NR, ESR neg, blood culture neg x 2, HIV NR,  - Echovirus ab - pending  Sepsis: Patient is septic with fever, elevated lactate and tachypnea. Unclear etiology Hemodynamically stable. -  IVF: received 2.5L of NS bolus in ED, followed by 100 cc/h - Lactate normalized  Nausea, vomiting, diarrhea secondary to acute colitis:  -Reports feeling much improved -IVF as above -Cont PRN Zofran for nausea -Lipase normal -cdiff neg -CT abd with findings of multifocal thickening of colonic wall in descending colon and sigmoid colon c/w diffuse colitis - Antibiotics since transitioned to ciprofloxacin and flagyl, to complete 7 additional days of abx on discharge  GERD: -on IV pepcid while in hospital  HLD: Last LDL was not on record -Continue home medications: Pravastatin  DM-II: on insulin pump. Last A1c not on record. Patient is also taking metformin at home -will continue insulin pump - Repeat A1c pending -Consulated to diabetic educator for help in management insulin pump  AKI: Likely due to prerenal secondary to dehydration. - Tolerated IVF with improvement in renal function - US-renal with mild R hydronephrosis. Pt voiding well and bladder with normal degree of distension -avoid NSIADs (Ed gave one dose of ibuprofen, will d/c)  DVT ppx: SCD's while inpatient  Procedures:  LP in ED  Consultations:    Discharge Exam: Filed Vitals:   09/07/15 0528 09/07/15 1452 09/07/15 2114 09/08/15 0510  BP: 121/71 120/59 139/83 124/75  Pulse: 72 71 59 61  Temp: 99.2 F (37.3 C) 98.3 F (36.8 C) 98.4 F (36.9 C) 98.1 F (36.7 C)  TempSrc: Oral Oral Oral Oral  Resp: 16 19 18 16   Height:      Weight:      SpO2: 93%  95% 100% 97%    General: awake, in nad Cardiovascular: regular, s1, s2 Respiratory: normal resp effort, no wheezing  Discharge Instructions     Medication List    STOP taking these medications        doxycycline 100 MG tablet  Commonly known as:  VIBRA-TABS      TAKE these medications        aspirin 81 MG tablet  Take 81 mg by mouth daily. Pt takes 1 tablet 4 times a week     CALCIUM-VITAMIN D PO  Take 1 tablet by mouth 2 (two) times daily.      ciprofloxacin 500 MG tablet  Commonly known as:  CIPRO  Take 1 tablet (500 mg total) by mouth 2 (two) times daily.     HYDROcodone-acetaminophen 5-325 MG per tablet  Commonly known as:  NORCO/VICODIN  Take 1-2 tablets by mouth every 4 (four) hours as needed for moderate pain.     insulin pump Soln  Inject into the skin.     metFORMIN 1000 MG tablet  Commonly known as:  GLUCOPHAGE  Take 1,000 mg by mouth 2 (two) times daily.     metroNIDAZOLE 500 MG tablet  Commonly known as:  FLAGYL  Take 1 tablet (500 mg total) by mouth 3 (three) times daily.     pantoprazole 40 MG tablet  Commonly known as:  PROTONIX  Take 40 mg by mouth 2 (two) times daily.     pravastatin 40 MG tablet  Commonly known as:  PRAVACHOL  Take 20 mg by mouth daily. Pt takes 1/2 tablet by mouth in the evening.     PREVACID 24HR PO  Take 1 capsule by mouth daily.     saccharomyces boulardii 250 MG capsule  Commonly known as:  FLORASTOR  Take 1 capsule (250 mg total) by mouth 2 (two) times daily.       Allergies  Allergen Reactions  . Codeine Nausea And Vomiting   Follow-up Information    Follow up with Jerlyn Ly, MD.   Specialty:  Internal Medicine   Why:  follow up as scheduled   Contact information:   [address] [place] [address] [phone]        The results of significant diagnostics from this hospitalization (including imaging, microbiology, ancillary and laboratory) are listed below for reference.    Significant Diagnostic Studies: Dg Chest 2 View  09/06/2015   CLINICAL DATA:  Possible [facility] spotted fever, feeling bad for 2 weeks with nausea, vomiting and diarrhea, headache. Hyperglycemia, history of diabetes.  EXAM: CHEST  2 VIEW  COMPARISON:  Chest radiograph Apr 29, 2008  FINDINGS: Cardiomediastinal silhouette is normal. The lungs are clear without pleural effusions or focal consolidations. Trachea projects midline and there is no pneumothorax. Soft tissue  planes and included osseous structures are non-suspicious. Mild pectus excavatum.  IMPRESSION: No acute cardiopulmonary process.   Electronically Signed   By: Elon Alas M.D.   On: 09/06/2015 01:02   Ct Abdomen Pelvis W Contrast  09/06/2015   CLINICAL DATA:  Nausea, vomiting, diarrhea starting Labor day, sepsis  EXAM: CT ABDOMEN AND PELVIS WITH CONTRAST  TECHNIQUE: Multidetector CT imaging of the abdomen and pelvis was performed using the standard protocol following bolus administration of intravenous contrast.  CONTRAST:  148m OMNIPAQUE IOHEXOL 300 MG/ML SOLN, 1 OMNIPAQUE IOHEXOL 300 MG/ML SOLN  COMPARISON:  None.  FINDINGS: Lung bases shows linear atelectasis or scarring in right lower lobe. Sagittal images of the  spine shows mild degenerative changes thoracolumbar spine. Liver, spleen, pancreas and adrenal glands are unremarkable. No calcified gallstones are noted within gallbladder. Kidneys shows a lobulated contour. Mild malrotation of the right kidney. No hydronephrosis or hydroureter. Delayed renal images shows bilateral renal symmetrical excretion. Bilateral visualized proximal ureter is unremarkable. No aortic aneurysm. Normal appendix. No pericecal inflammation. There is mild thickening of colonic wall in right colon. Thickening of colonic wall is noted in hepatic flexure of the colon. There is thickening of colonic wall in distal transverse colon. Significant thickening of the colonic wall in descending colon and sigmoid colon. Mild stranding of pericolonic fat in descending colon and sigmoid colon. Small amount of free fluid noted within posterior pelvis. Findings are consistent with diffuse colitis. Infectious colitis cannot be excluded. Clinical correlation is necessary. There is no evidence of pericolonic abscess. No small bowel obstruction. No free abdominal air. Prostate gland measures 4.9 by 3.5 cm. There is no inguinal adenopathy. Urinary bladder is unremarkable.  Small nonspecific  lymph nodes are noted in right lower quadrant mesentery the largest in axial image 68 measures 8 mm.  IMPRESSION: 1. There is multifocal thickening of colonic wall. Significant thickening of colonic wall in descending colon and sigmoid colon. Findings are consistent with diffuse colitis. Infectious colitis cannot be excluded. Clinical correlation is necessary. 2. Small amount of free fluid noted within posterior pelvis. 3. No hydronephrosis or hydroureter. 4. Normal appendix.  No pericecal inflammation. 5. No small bowel obstruction.   Electronically Signed   By: Lahoma Crocker M.D.   On: 09/06/2015 19:38   US Renal  09/06/2015   CLINICAL DATA:  Acute kidney injury.  EXAM: RENAL / URINARY TRACT ULTRASOUND COMPLETE  COMPARISON:  None.  FINDINGS: Right Kidney:  Length: 12.1 cm. Dilatation of the renal pelvis and major calices consistent with hydronephrosis. Echogenicity within normal limits. No mass visualized.  Left Kidney:  Length: 12.6 cm. Echogenicity within normal limits. No mass or hydronephrosis visualized.  Bladder:  Appears normal for degree of bladder distention.  IMPRESSION: 1. Mild right hydronephrosis. 2. Normal appearance of the left kidney and bladder.   Electronically Signed   By: Jeb Levering M.D.   On: 09/06/2015 04:57    Microbiology: Recent Results (from the past 240 hour(s))  Blood culture (routine x 2)     Status: None (Preliminary result)   Collection Time: 09/05/15  3:30 AM  Result Value Ref Range Status   Specimen Description BLOOD LEFT ARM  Final   Special Requests BOTTLES DRAWN AEROBIC AND ANAEROBIC 10CC  Final   Culture NO GROWTH 1 DAY  Final   Report Status PENDING  Incomplete  CSF culture     Status: None (Preliminary result)   Collection Time: 09/05/15 11:21 PM  Result Value Ref Range Status   Specimen Description CSF  Final   Special Requests NONE  Final   Gram Stain   Final    CYTOSPIN SMEAR WBC PRESENT, PREDOMINANTLY MONONUCLEAR NO ORGANISMS SEEN    Culture NO  GROWTH 2 DAYS  Final   Report Status PENDING  Incomplete  Blood culture (routine x 2)     Status: None (Preliminary result)   Collection Time: 09/06/15  1:40 AM  Result Value Ref Range Status   Specimen Description BLOOD RIGHT HAND  Final   Special Requests BOTTLES DRAWN AEROBIC AND ANAEROBIC 10CC   Final   Culture NO GROWTH 1 DAY  Final   Report Status PENDING  Incomplete  C difficile quick  scan w PCR reflex     Status: None   Collection Time: 09/06/15  3:46 AM  Result Value Ref Range Status   C Diff antigen NEGATIVE NEGATIVE Final   C Diff toxin NEGATIVE NEGATIVE Final   C Diff interpretation Negative for toxigenic C. difficile  Final     Labs: Basic Metabolic Panel:  Recent Labs Lab 09/05/15 1923 09/06/15 0358 09/07/15 0521 09/08/15 0501  NA 131* 134* 137 141  K 3.9 3.8 3.5 3.1*  CL 99* 105 104 107  CO2 22 22 27 28   GLUCOSE 306* 216* 255* 112*  BUN 13 12 <5* <5*  CREATININE 1.30* 1.10 1.13 0.90  CALCIUM 9.3 7.6* 8.2* 8.2*   Liver Function Tests:  Recent Labs Lab 09/05/15 1923 09/08/15 0501  AST 38 34  ALT 31 31  ALKPHOS 82 58  BILITOT 1.8* 1.1  PROT 6.8 4.8*  ALBUMIN 4.0 2.9*    Recent Labs Lab 09/06/15 0155  LIPASE 17*   No results for input(s): AMMONIA in the last 168 hours. CBC:  Recent Labs Lab 09/05/15 1923 09/06/15 0358 09/07/15 0521 09/08/15 0501  WBC 9.8 5.8 4.4 4.7  NEUTROABS 8.4*  --   --   --   HGB 15.8 12.8* 12.6* 12.2*  HCT 43.3 35.6* 34.8* 33.1*  MCV 87.3 87.3 87.0 86.0  PLT 180 139* 148* 135*   Cardiac Enzymes: No results for input(s): CKTOTAL, CKMB, CKMBINDEX, TROPONINI in the last 168 hours. BNP: BNP (last 3 results) No results for input(s): BNP in the last 8760 hours.  ProBNP (last 3 results) No results for input(s): PROBNP in the last 8760 hours.  CBG:  Recent Labs Lab 09/06/15 0803 09/06/15 1918 09/07/15 0803 09/08/15 0208 09/08/15 0803  GLUCAP 189* 211* 204* 111* 123*    Signed:  Kammy Klett  K  [facility] 09/08/2015, 11:25 AM

## 2015-09-08 NOTE — Plan of Care (Signed)
Problem: Phase III Progression Outcomes Goal: Foley discontinued Outcome: Not Applicable Date Met:  28/83/37 Pt voids

## 2015-09-08 NOTE — Progress Notes (Signed)
OT Cancellation Note  Patient Details Name: Louis Joseph MRN: [id number] DOB: [birth year]   Cancelled Treatment:    Reason Eval/Treat Not Completed: Other (comment) (pt discharged). According to chart and PT's note, pt had no issues with mobility.   CLAY,PATRICIA , MS, OTR/L, CLT Pager: [phone]  09/08/2015, 12:29 PM

## 2015-09-08 NOTE — Progress Notes (Signed)
Inpatient Diabetes Program Recommendations  AACE/ADA: New Consensus Statement on Inpatient Glycemic Control (2015)  Target Ranges:  Prepandial:   less than 140 mg/dL      Peak postprandial:   less than 180 mg/dL (1-2 hours)      Critically ill patients:  140 - 180 mg/dL  LATE ENTRY Review of Glycemic Control  Diabetes history: DM2 Outpatient Diabetes medications: insulin pump, metformin 1000 mg bid Current orders for Inpatient glycemic control: Insulin pump  Inpatient Diabetes Program Recommendations:     Paged MD to change insulin pump order to insulin pump order set. Talked with RN regarding same. Instructed to follow sidebar for orders. Have contract signed and give CHO sheet for pt to record all boluses. Assessment Q shift. RN states pt is very knowledgeable regarding insulin pump. Sees Dr. Joylene Draft for diabetes management.  Will obtain insulin pump settings on 9/19. Awaiting HgbA1C.  Thank you. Lorenda Peck, RD, LDN, CDE Inpatient Diabetes Coordinator [phone]

## 2015-09-08 NOTE — Progress Notes (Signed)
Patient was discharged home by MD order; discharged instructions  review and give to patient with care notes and prescriptions; IV DIC; patient will be escorted to the car by nurse tech via wheelchair.  

## 2015-09-08 NOTE — Progress Notes (Signed)
Spoke with patient regarding diabetes and home regimen for diabetes management.  Patient states that he was diagnosed with diabetes at the age of 54 years old and he is followed by Dr. Joylene Draft for diabetes management. Patient uses a [employer]tr[employer] insulin pump as an outpatient. Patient has insulin pump connected at this time. Asked patient to review insulin pump settings and he states that he is not quite sure about how to get to all his settings. Patient disconnected insulin pump for infusion site so pump settings could be reviewed.  Current insulin pump settings are as follows:  Basal insulin  12A 1.10 units/hour 7A 0.90 units/hour 12P 0.40 units/hour 3P 0.70 units/hour 10P 0.80 units/hour  Carb Coverage 1:10 (1 unit for every 10 grams of carbohydrates)  Insulin Sensitivity 1:40 1 unit drops blood glucose 40 mg/dl  Target Glucose Goals 100-100 mg/dl  In talking with the patient he states that his blood glucose normally runs very good but his glucose has been up for about 2 weeks. Patient reports this his last A1C was 6.8%. Patient has been using continuous glucose monitoring for a few weeks to help hs doctor identify glucose trends to help with making adjustments with his insulin pump. Patient reports that he has an appointment with his doctor this afternoon.  Patient verbalized understanding of information discussed and states that he does not have any further questions related to diabetes at this time.  Thanks, Barnie Alderman, RN, MSN, CCRN, CDE Diabetes Coordinator Inpatient Diabetes Program [phone] (Team Pager from [place] to [place]) [phone] ([facility] office) [phone] [facility] office) [phone] [facility] [place] office)

## 2015-09-08 NOTE — Progress Notes (Signed)
Discharge instruction given. Patient verbalized understanding using teach back . Patient room checked for belongings. Patient wife was present during discharge and prescriptions given to patient and wife placed prescription and  discharge paper work in a bag.

## 2015-09-09 LAB — CSF CULTURE W GRAM STAIN: Culture: NO GROWTH

## 2015-09-09 LAB — CSF CULTURE

## 2015-09-09 LAB — ROCKY MTN SPOTTED FVR ABS PNL(IGG+IGM)
RMSF IgG: POSITIVE — AB
RMSF IgM: 0.28 index (ref 0.00–0.89)

## 2015-09-09 LAB — RMSF, IGG, IFA

## 2015-09-11 LAB — CULTURE, BLOOD (ROUTINE X 2)
CULTURE: NO GROWTH
CULTURE: NO GROWTH

## 2015-09-11 LAB — MISCELLANEOUS TEST

## 2015-09-15 LAB — B. BURGDORFI ANTIBODIES, CSF
ALBUMIN CSF: 13.7 mg/dL (ref 10.4–43.2)
ALBUMIN INDEX RATIO: 3.6
ALBUMIN SERUM: 3761 mg/dL (ref 3700–5100)
B. Burgdorferi IgG Serum: 0.21
B. Burgdorferi IgM Serum: 0.21
B. burgdorferi IgG CSF: <0.08
IGG TOTAL SERUM: 435 mg/dL — AB (ref 540–1423)
IGM (LOC): 0.209
IGM INDEX: 1.5
IGM TOTAL SERUM: 70 mg/dL (ref 52–217)
IgG Total CSF: <1.56 mg/dl (ref 0.00–3.06)
IgM Total CSF: 0.4 mg/dL (ref 0.0–0.6)

## 2015-10-14 LAB — STOOL CULTURE

## 2016-07-14 ENCOUNTER — Ambulatory Visit (INDEPENDENT_AMBULATORY_CARE_PROVIDER_SITE_OTHER): Payer: BC Managed Care – PPO

## 2016-07-14 ENCOUNTER — Encounter (HOSPITAL_COMMUNITY): Payer: Self-pay | Admitting: Emergency Medicine

## 2016-07-14 ENCOUNTER — Ambulatory Visit (HOSPITAL_COMMUNITY)
Admission: EM | Admit: 2016-07-14 | Discharge: 2016-07-14 | Disposition: A | Discharge status: Home / Self Care | Payer: BC Managed Care – PPO | Attending: Family Medicine | Admitting: Family Medicine

## 2016-07-14 DIAGNOSIS — S6710XA Crushing injury of unspecified finger(s), initial encounter: Secondary | ICD-10-CM | POA: Diagnosis not present

## 2016-07-14 MED ORDER — TRAMADOL HCL 50 MG PO TABS: 50.0000 mg | ORAL_TABLET | Freq: Four times a day (QID) | ORAL | 0 refills | Status: AC | PRN

## 2016-07-14 MED ORDER — CEPHALEXIN 500 MG PO CAPS: 500.0000 mg | ORAL_CAPSULE | Freq: Four times a day (QID) | ORAL | 0 refills | Status: DC

## 2016-07-14 NOTE — ED Triage Notes (Signed)
Patient states he mashed left middle finger in saw blade.

## 2016-07-14 NOTE — ED Provider Notes (Signed)
[facility]    CSN: [id number] Arrival date & time: 07/14/16  1251  First Provider Contact:  None       History   Chief Complaint Chief Complaint  Patient presents with  . Finger Injury    HPI Louis Joseph is a 55 y.o. male.    Hand Injury  Location:  Finger Finger location:  L middle finger Injury: yes   Time since incident:  1 hour Mechanism of injury: crush   Mechanism of injury comment:  Sawing board and sudden jerk caused crush injury to left middle finger distal phalanx. Crush injury:    Mechanism:  Power tool Pain details:    Severity:  Moderate   Onset quality:  Sudden   Progression:  Unchanged Dislocation: no   Foreign body present:  No foreign bodies Prior injury to area:  Yes (s/p graft skin to finger yrs ago.) Relieved by:  None tried Worsened by:  Nothing Ineffective treatments:  None tried Associated symptoms: decreased range of motion     Past Medical History:  Diagnosis Date  . Colon adenomas   . Diabetes mellitus without complication ([place])   . DM w/o complication type II ([place]) 03/19/2015  . Dyslipidemia 03/19/2015  . GERD (gastroesophageal reflux disease) 03/19/2015  . Gilbert's syndrome   . Hearing loss    s/p hearing aides  . Osteopenia   . PUD (peptic ulcer disease)     Patient Active Problem List   Diagnosis Date Noted  . Acute colitis 09/08/2015  . Fever 09/06/2015  . Nausea vomiting and diarrhea 09/06/2015  . Headache 09/06/2015  . Sepsis ([place]) 09/06/2015  . AKI (acute kidney injury) ([place]) 09/06/2015  . Bad headache   . Dyslipidemia 03/19/2015  . GERD (gastroesophageal reflux disease) 03/19/2015  . SOB (shortness of breath) 03/19/2015  . DM w/o complication type II ([place]) 03/19/2015  . Gilbert's syndrome   . Osteopenia   . PUD (peptic ulcer disease)   . Colon adenomas   . Hearing loss     Past Surgical History:  Procedure Laterality Date  . COLONOSCOPY N/A 12/02/2014   Procedure: COLONOSCOPY;   Surgeon: Garlan Fair, MD;  Location: [facility] ENDOSCOPY;  Service: Endoscopy;  Laterality: N/A;  . HAND SURGERY    . KNEE SURGERY         Home Medications    Prior to Admission medications   Medication Sig Start Date End Date Taking? Authorizing Provider  aspirin 81 MG tablet Take 81 mg by mouth daily. Pt takes 1 tablet 4 times a week    Historical Provider, MD  CALCIUM-VITAMIN D PO Take 1 tablet by mouth 2 (two) times daily.    Historical Provider, MD  cephALEXin (KEFLEX) 500 MG capsule Take 1 capsule (500 mg total) by mouth 4 (four) times daily. Take all of medicine and drink lots of fluids 07/14/16   Billy Fischer, MD  ciprofloxacin (CIPRO) 500 MG tablet Take 1 tablet (500 mg total) by mouth 2 (two) times daily. 09/08/15   Donne Hazel, MD  HYDROcodone-acetaminophen (NORCO/VICODIN) 5-325 MG per tablet Take 1-2 tablets by mouth every 4 (four) hours as needed for moderate pain. 09/08/15   Donne Hazel, MD  Insulin Human (INSULIN PUMP) 100 unit/ml SOLN Inject into the skin.    Historical Provider, MD  Lansoprazole (PREVACID 24HR PO) Take 1 capsule by mouth daily.     Historical Provider, MD  metFORMIN (GLUCOPHAGE) 1000 MG tablet Take 1,000 mg by mouth  2 (two) times daily. 03/17/15   Historical Provider, MD  metroNIDAZOLE (FLAGYL) 500 MG tablet Take 1 tablet (500 mg total) by mouth 3 (three) times daily. 09/08/15   Donne Hazel, MD  pantoprazole (PROTONIX) 40 MG tablet Take 40 mg by mouth 2 (two) times daily. 03/12/15   Historical Provider, MD  pravastatin (PRAVACHOL) 40 MG tablet Take 20 mg by mouth daily. Pt takes 1/2 tablet by mouth in the evening. 02/13/15   Historical Provider, MD  saccharomyces boulardii (FLORASTOR) 250 MG capsule Take 1 capsule (250 mg total) by mouth 2 (two) times daily. 09/08/15   Donne Hazel, MD    Family History Family History  Problem Relation Age of Onset  . Diabetes Other   . Uterine cancer Mother   . Heart disease Father   . Diabetes Father   .  Hypertension Father   . Hypertension Sister     Social History Social History  Substance Use Topics  . Smoking status: Never Smoker  . Smokeless tobacco: Never Used  . Alcohol use No     Allergies   Codeine   Review of Systems Review of Systems  Constitutional: Negative.   Musculoskeletal: Negative for joint swelling.  Skin: Positive for wound.     Physical Exam Triage Vital Signs ED Triage Vitals  Enc Vitals Group     BP 07/14/16 1328 145/85     Pulse Rate 07/14/16 1328 66     Resp 07/14/16 1328 18     Temp 07/14/16 1328 98 F (36.7 C)     Temp Source 07/14/16 1328 Oral     SpO2 07/14/16 1328 98 %     Weight 07/14/16 1328 198 lb (89.8 kg)     Height 07/14/16 1328 5\' 11"  (1.803 m)     Head Circumference --      Peak Flow --      Pain Score 07/14/16 1329 9     Pain Loc --      Pain Edu? --      Excl. in [place]? --    No data found.   Updated Vital Signs BP 145/85 (BP Location: Right Arm)   Pulse 66   Temp 98 F (36.7 C) (Oral)   Resp 18   Ht 5\' 11"  (1.803 m)   Wt 198 lb (89.8 kg)   SpO2 98%   BMI 27.62 kg/m   Visual Acuity Right Eye Distance:   Left Eye Distance:   Bilateral Distance:    Right Eye Near:   Left Eye Near:    Bilateral Near:     Physical Exam  Constitutional: He is oriented to person, place, and time. He appears well-developed and well-nourished.  Musculoskeletal: He exhibits tenderness.       Left hand: He exhibits tenderness and deformity.       Hands: Neurological: He is alert and oriented to person, place, and time.     UC Treatments / Results  Labs (all labs ordered are listed, but only abnormal results are displayed) Labs Reviewed - No data to display  EKG  EKG Interpretation None       Radiology Dg Finger Middle Left  Result Date: 07/14/2016 CLINICAL DATA:  Crush injury to middle finger. EXAM: LEFT MIDDLE FINGER 2+V COMPARISON:  None. FINDINGS: Subtle fracture involving the distal phalanx heel tuft is  identified. MP joints are located. No worrisome lytic or sclerotic osseous abnormality. IMPRESSION: Subtle nondisplaced fracture involving the tuft of the distal phalanx. Electronically  Signed   By: Misty Stanley M.D.   On: 07/14/2016 14:18  X-rays reviewed and report per radiologist.  Procedures Procedures (including critical care time)  Medications Ordered in UC Medications - No data to display   Initial Impression / Assessment and Plan / UC Course  I have reviewed the triage vital signs and the nursing notes.  Pertinent labs & imaging results that were available during my care of the patient were reviewed by me and considered in my medical decision making (see chart for details).  Clinical Course    Tet utd, wound care.  Final Clinical Impressions(s) / UC Diagnoses   Final diagnoses:  Crush injury to finger, initial encounter    New Prescriptions New Prescriptions   CEPHALEXIN (KEFLEX) 500 MG CAPSULE    Take 1 capsule (500 mg total) by mouth 4 (four) times daily. Take all of medicine and drink lots of fluids     Billy Fischer, MD 07/14/16 1445

## 2018-04-10 DIAGNOSIS — M19012 Primary osteoarthritis, left shoulder: Secondary | ICD-10-CM | POA: Insufficient documentation

## 2018-07-26 DIAGNOSIS — M7740 Metatarsalgia, unspecified foot: Secondary | ICD-10-CM | POA: Insufficient documentation

## 2018-11-21 DIAGNOSIS — M795 Residual foreign body in soft tissue: Secondary | ICD-10-CM | POA: Insufficient documentation

## 2018-11-21 DIAGNOSIS — M19049 Primary osteoarthritis, unspecified hand: Secondary | ICD-10-CM | POA: Insufficient documentation

## 2019-06-14 DIAGNOSIS — M65311 Trigger thumb, right thumb: Secondary | ICD-10-CM | POA: Insufficient documentation

## 2020-10-16 ENCOUNTER — Encounter (INDEPENDENT_AMBULATORY_CARE_PROVIDER_SITE_OTHER): Payer: BC Managed Care – PPO | Admitting: Ophthalmology

## 2020-11-03 ENCOUNTER — Encounter (INDEPENDENT_AMBULATORY_CARE_PROVIDER_SITE_OTHER): Payer: Self-pay | Admitting: Ophthalmology

## 2020-11-03 ENCOUNTER — Other Ambulatory Visit: Payer: Self-pay

## 2020-11-03 ENCOUNTER — Ambulatory Visit (INDEPENDENT_AMBULATORY_CARE_PROVIDER_SITE_OTHER): Payer: BC Managed Care – PPO | Admitting: Ophthalmology

## 2020-11-03 DIAGNOSIS — H2513 Age-related nuclear cataract, bilateral: Secondary | ICD-10-CM | POA: Diagnosis not present

## 2020-11-03 DIAGNOSIS — E113393 Type 2 diabetes mellitus with moderate nonproliferative diabetic retinopathy without macular edema, bilateral: Secondary | ICD-10-CM

## 2020-11-03 DIAGNOSIS — H35043 Retinal micro-aneurysms, unspecified, bilateral: Secondary | ICD-10-CM | POA: Diagnosis not present

## 2020-11-03 NOTE — Assessment & Plan Note (Signed)

## 2020-11-03 NOTE — Progress Notes (Signed)
11/03/2020     CHIEF COMPLAINT Patient presents for Retina Follow Up   HISTORY OF PRESENT ILLNESS: Louis Joseph is a 59 y.o. male who presents to the clinic today for:   HPI    Retina Follow Up    Patient presents with  Diabetic Retinopathy.  In both eyes.  Severity is moderate.  Duration of 9 months.  Since onset it is stable.  I, the attending physician,  performed the HPI with the patient and updated documentation appropriately.          Comments    9 Month Diabetic f\u OU. OCT  Pt states he has not noticed any changes in vision. Denies any complaints. BGL: 139 this AM A1C: 6.8         Last edited by Tilda Franco on 11/03/2020  1:07 PM. (History)      Referring physician: Crist Infante, MD [place],  [place] [id number]  HISTORICAL INFORMATION:   Selected notes from the MEDICAL RECORD NUMBER    Lab Results  Component Value Date   HGBA1C 7.3 (H) 09/06/2015     CURRENT MEDICATIONS: No current outpatient medications on file. (Ophthalmic Drugs)   No current facility-administered medications for this visit. (Ophthalmic Drugs)   Current Outpatient Medications (Other)  Medication Sig  . aspirin 81 MG tablet Take 81 mg by mouth daily. Pt takes 1 tablet 4 times a week  . CALCIUM-VITAMIN D PO Take 1 tablet by mouth 2 (two) times daily.  . cephALEXin (KEFLEX) 500 MG capsule Take 1 capsule (500 mg total) by mouth 4 (four) times daily. Take all of medicine and drink lots of fluids  . ciprofloxacin (CIPRO) 500 MG tablet Take 1 tablet (500 mg total) by mouth 2 (two) times daily.  Marland Kitchen HYDROcodone-acetaminophen (NORCO/VICODIN) 5-325 MG per tablet Take 1-2 tablets by mouth every 4 (four) hours as needed for moderate pain.  . Insulin Human (INSULIN PUMP) 100 unit/ml SOLN Inject into the skin.  . Lansoprazole (PREVACID 24HR PO) Take 1 capsule by mouth daily.   . metFORMIN (GLUCOPHAGE) 1000 MG tablet Take 1,000 mg by mouth 2 (two) times daily.  .  metroNIDAZOLE (FLAGYL) 500 MG tablet Take 1 tablet (500 mg total) by mouth 3 (three) times daily.  . pantoprazole (PROTONIX) 40 MG tablet Take 40 mg by mouth 2 (two) times daily.  . pravastatin (PRAVACHOL) 40 MG tablet Take 20 mg by mouth daily. Pt takes 1/2 tablet by mouth in the evening.  . saccharomyces boulardii (FLORASTOR) 250 MG capsule Take 1 capsule (250 mg total) by mouth 2 (two) times daily.  . traMADol (ULTRAM) 50 MG tablet Take 1 tablet (50 mg total) by mouth every 6 (six) hours as needed for moderate pain.   No current facility-administered medications for this visit. (Other)      REVIEW OF SYSTEMS: ROS    Positive for: Endocrine   Last edited by Tilda Franco on 11/03/2020  1:07 PM. (History)       ALLERGIES Allergies  Allergen Reactions  . Codeine Nausea And Vomiting    PAST MEDICAL HISTORY Past Medical History:  Diagnosis Date  . Colon adenomas   . Diabetes mellitus without complication ([place])   . DM w/o complication type II ([place]) 03/19/2015  . Dyslipidemia 03/19/2015  . GERD (gastroesophageal reflux disease) 03/19/2015  . Gilbert's syndrome   . Hearing loss    s/p hearing aides  . Osteopenia   . PUD (peptic ulcer disease)  Past Surgical History:  Procedure Laterality Date  . COLONOSCOPY N/A 12/02/2014   Procedure: COLONOSCOPY;  Surgeon: Garlan Fair, MD;  Location: [facility];  Service: Endoscopy;  Laterality: N/A;  . HAND SURGERY    . KNEE SURGERY      FAMILY HISTORY Family History  Problem Relation Age of Onset  . Diabetes Other   . Uterine cancer Mother   . Heart disease Father   . Diabetes Father   . Hypertension Father   . Hypertension Sister     SOCIAL HISTORY Social History   Tobacco Use  . Smoking status: Never Smoker  . Smokeless tobacco: Never Used  Substance Use Topics  . Alcohol use: No  . Drug use: No         OPHTHALMIC EXAM: Base Eye Exam    Visual Acuity (Snellen - Linear)      Right Left   Dist [place]  20/20 20/20       Tonometry (Tonopen, 1:11 PM)      Right Left   Pressure 15 16       Pupils      Pupils Dark Light Shape React APD   Right PERRL 4 3 Round Brisk None   Left PERRL 4 3 Round Brisk None       Visual Fields (Counting fingers)      Left Right    Full Full       Neuro/Psych    Oriented x3: Yes   Mood/Affect: Normal       Dilation    Both eyes: 1.0% Mydriacyl, 2.5% Phenylephrine @ 1:12 PM        Slit Lamp and Fundus Exam    External Exam      Right Left   External Normal Normal       Slit Lamp Exam      Right Left   Lids/Lashes Normal Normal   Conjunctiva/Sclera White and quiet White and quiet   Cornea Clear Clear   Anterior Chamber Deep and quiet Deep and quiet   Iris Round and reactive Round and reactive   Anterior Vitreous Normal Normal       Fundus Exam      Right Left   Posterior Vitreous Normal Normal   Disc Normal Normal   C/D Ratio 0.4 0.4   Macula no macular thickening, Microaneurysms no macular thickening, Microaneurysms   Vessels NPDR- Moderate NPDR- Moderate   Periphery Normal Normal          IMAGING AND PROCEDURES  Imaging and Procedures for 11/03/20  OCT, Retina - OU - Both Eyes       Right Eye Quality was good. Scan locations included subfoveal. Central Foveal Thickness: 280. Progression has been stable. Findings include abnormal foveal contour.   Left Eye Quality was good. Scan locations included subfoveal. Central Foveal Thickness: 283. Progression has been stable. Findings include abnormal foveal contour.                 ASSESSMENT/PLAN:  Moderate nonproliferative diabetic retinopathy of both eyes (HCC) The nature of moderate nonproliferative diabetic retinopathy was discussed with the patient as well as the need for more frequent follow up to judge for progression. Good blood glucose, blood pressure, and serum lipid control was recommended as well as avoidance of smoking and maintenance of normal weight.   Close follow up with PCP encouraged.  Nuclear sclerotic cataract of both eyes The nature of cataract was discussed with the patient as well as  the elective nature of surgery. The patient was reassured that surgery at a later date does not put the patient at risk for a worse outcome. It was emphasized that the need for surgery is dictated by the patient's quality of life as influenced by the cataract. Patient was instructed to maintain close follow up with their general eye care doctor.      ICD-10-CM   1. Moderate nonproliferative diabetic retinopathy of both eyes without macular edema associated with type 2 diabetes mellitus (HCC)  V40.0867 OCT, Retina - OU - Both Eyes  2. Nuclear sclerotic cataract of both eyes  H25.13   3. 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  Moderate nonproliferative diabetic retinopathy with no signs of clinical progression today  2.  Improved but blood sugar control   3.  Mild nuclear sclerotic cataract no progression  Ophthalmic Meds Ordered this visit:  No orders of the defined types were placed in this encounter.      Return in about 1 year (around 11/03/2021) for DILATE OU, COLOR FP.  There are no Patient Instructions on file for this visit.   Explained the diagnoses, plan, and follow up with the patient and they expressed understanding.  Patient expressed understanding of the importance of proper follow up care.   Clent Demark Holly Pring M.D. Diseases & Surgery of the Retina and Vitreous [facility] [place] 11/03/20     Abbreviations: M myopia (nearsighted); A astigmatism; H hyperopia (farsighted); P presbyopia; Mrx spectacle prescription;  CTL contact lenses; OD right eye; OS left eye; OU both eyes  XT exotropia; ET esotropia; PEK punctate epithelial keratitis; PEE punctate epithelial erosions; DES dry eye syndrome; MGD meibomian gland dysfunction; ATs artificial tears; PFAT's preservative free artificial tears; [place] nuclear sclerotic  cataract; PSC posterior subcapsular cataract; ERM epi-retinal membrane; PVD posterior vitreous detachment; RD retinal detachment; DM diabetes mellitus; DR diabetic retinopathy; NPDR non-proliferative diabetic retinopathy; PDR proliferative diabetic retinopathy; CSME clinically significant macular edema; DME diabetic macular edema; dbh dot blot hemorrhages; CWS cotton wool spot; POAG primary open angle glaucoma; C/D cup-to-disc ratio; HVF humphrey visual field; GVF goldmann visual field; OCT optical coherence tomography; IOP intraocular pressure; BRVO Branch retinal vein occlusion; CRVO central retinal vein occlusion; CRAO central retinal artery occlusion; BRAO branch retinal artery occlusion; RT retinal tear; SB scleral buckle; PPV pars plana vitrectomy; VH Vitreous hemorrhage; PRP panretinal laser photocoagulation; IVK intravitreal kenalog; VMT vitreomacular traction; MH Macular hole;  NVD neovascularization of the disc; NVE neovascularization elsewhere; AREDS age related eye disease study; ARMD age related macular degeneration; POAG primary open angle glaucoma; EBMD epithelial/anterior basement membrane dystrophy; ACIOL anterior chamber intraocular lens; IOL intraocular lens; PCIOL posterior chamber intraocular lens; Phaco/IOL phacoemulsification with intraocular lens placement; [place] photorefractive keratectomy; LASIK laser assisted in situ keratomileusis; HTN hypertension; DM diabetes mellitus; COPD chronic obstructive pulmonary disease

## 2020-11-03 NOTE — Assessment & Plan Note (Signed)
The nature of moderate nonproliferative diabetic retinopathy was discussed with the patient as well as the need for more frequent follow up to judge for progression. Good blood glucose, blood pressure, and serum lipid control was recommended as well as avoidance of smoking and maintenance of normal weight.  Close follow up with PCP encouraged. °

## 2021-05-11 ENCOUNTER — Other Ambulatory Visit: Payer: Self-pay | Admitting: Internal Medicine

## 2021-05-11 DIAGNOSIS — E785 Hyperlipidemia, unspecified: Secondary | ICD-10-CM

## 2021-06-01 ENCOUNTER — Ambulatory Visit
Admission: RE | Admit: 2021-06-01 | Discharge: 2021-06-01 | Disposition: A | Discharge status: Home / Self Care | Payer: No Typology Code available for payment source | Source: Ambulatory Visit | Attending: Internal Medicine | Admitting: Internal Medicine

## 2021-06-01 DIAGNOSIS — E785 Hyperlipidemia, unspecified: Secondary | ICD-10-CM

## 2021-06-03 ENCOUNTER — Ambulatory Visit: Payer: Self-pay

## 2021-06-03 ENCOUNTER — Ambulatory Visit: Payer: BC Managed Care – PPO | Admitting: Orthopaedic Surgery

## 2021-06-03 ENCOUNTER — Telehealth: Payer: Self-pay

## 2021-06-03 DIAGNOSIS — M25562 Pain in left knee: Secondary | ICD-10-CM | POA: Diagnosis not present

## 2021-06-03 DIAGNOSIS — G8929 Other chronic pain: Secondary | ICD-10-CM | POA: Diagnosis not present

## 2021-06-03 DIAGNOSIS — M1712 Unilateral primary osteoarthritis, left knee: Secondary | ICD-10-CM | POA: Diagnosis not present

## 2021-06-03 MED ORDER — METHYLPREDNISOLONE ACETATE 40 MG/ML IJ SUSP
40.0000 mg | INTRAMUSCULAR | Status: AC | PRN
  Administered 2021-06-03: 40 mg via INTRA_ARTICULAR

## 2021-06-03 MED ORDER — LIDOCAINE HCL 1 % IJ SOLN
3.0000 mL | INTRAMUSCULAR | Status: AC | PRN
  Administered 2021-06-03: 3 mL

## 2021-06-03 NOTE — Telephone Encounter (Signed)
Left knee gel injection ?

## 2021-06-03 NOTE — Progress Notes (Signed)
Office Visit Note   Patient: Louis Joseph           Date of Birth: [birth year]           MRN: [id number] Visit Date: 06/03/2021              Requested by: Crist Infante, MD [address] [place],  [place] [address] PCP: Crist Infante, MD   Assessment & Plan: Visit Diagnoses:  1. Chronic pain of left knee   2. Unilateral primary osteoarthritis, left knee     Plan: I did aspirate 40 to 50 cc of yellow fluid from the knee that gave him immediate relief.  I did place a steroid injection in his knee per his wishes but I counseled him about how this may additionally affect his blood glucose and he said he will watch this closely and getting reassurance that he would do so.  He actually sees a physician about his blood glucose later today.  He is the perfect candidate for hyaluronic acid for that knee we will order hyaluronic acid for left knee to treat the pain from osteoarthritis.  He will still work on quad strengthening exercises as well.  He can take anti-inflammatories as needed as well.  Follow-Up Instructions: Return in about 4 weeks (around 07/01/2021).   Orders:  Orders Placed This Encounter  Procedures   Large Joint Inj   XR Knee 1-2 Views Left   No orders of the defined types were placed in this encounter.     Procedures: Large Joint Inj: L knee on 06/03/2021 2:35 PM Indications: diagnostic evaluation and pain Details: 22 G 1.5 in needle, superolateral approach  Arthrogram: No  Medications: 3 mL lidocaine 1 %; 40 mg methylPREDNISolone acetate 40 MG/ML Outcome: tolerated well, no immediate complications Procedure, treatment alternatives, risks and benefits explained, specific risks discussed. Consent was given by the patient. Immediately prior to procedure a time out was called to verify the correct patient, procedure, equipment, support staff and site/side marked as required. Patient was prepped and draped in the usual sterile fashion.      Clinical Data: No  additional findings.   Subjective: Chief Complaint  Patient presents with   Left Knee - Pain  The patient is an active 60 year old gentleman who comes in with worsening left knee pain.  He said he fell a few weeks ago and twisted the knee and has been worsening since then.  He has had some chronic soreness and stiffness with swelling in that left knee but now is become more acute.  He does have a history of a left knee arthroscopy done about 20 years ago he states.  Most of his pain is on the medial aspect of his knee.  He is a diabetic and reports hemoglobin A1c of 7.8.  He does take insulin.  HPI  Review of Systems He denies any headache, chest pain, shortness of breath, fever, chills, nausea, vomiting  Objective: Vital Signs: There were no vitals taken for this visit.  Physical Exam He is alert and orient x3 and in no acute distress Ortho Exam Examination of his right knee is normal examination of his left painful knee shows moderate effusion.  There is significant medial joint line tenderness and a positive McMurray's sign to the medial compartment of the knee.  There is some slight patellofemoral crepitation as well.  The knee feels ligamentously stable. Specialty Comments:  No specialty comments available.  Imaging: XR Knee 1-2 Views Left  Result Date:  06/03/2021 2 views of the left knee show tricompartmental arthritic changes with slight varus malalignment, significant medial joint space narrowing and patellofemoral narrowing.  There are osteophytes in the medial and patellofemoral compartments.    PMFS History: Patient Active Problem List   Diagnosis Date Noted   Moderate nonproliferative diabetic retinopathy of both eyes ([place]) 11/03/2020   Nuclear sclerotic cataract of both eyes 11/03/2020   Retinal microaneurysm of both eyes 11/03/2020   Acute colitis 09/08/2015   Fever 09/06/2015   Nausea vomiting and diarrhea 09/06/2015   Headache 09/06/2015   Sepsis ([place])  09/06/2015   AKI (acute kidney injury) ([place]) 09/06/2015   Bad headache    Dyslipidemia 03/19/2015   GERD (gastroesophageal reflux disease) 03/19/2015   SOB (shortness of breath) 09/60/4540   DM w/o complication type II ([place]) 03/19/2015   Gilbert's syndrome    Osteopenia    PUD (peptic ulcer disease)    Colon adenomas    Hearing loss    Past Medical History:  Diagnosis Date   Colon adenomas    Diabetes mellitus without complication ([place])    DM w/o complication type II ([place]) 03/19/2015   Dyslipidemia 03/19/2015   GERD (gastroesophageal reflux disease) 03/19/2015   Gilbert's syndrome    Hearing loss    s/p hearing aides   Osteopenia    PUD (peptic ulcer disease)     Family History  Problem Relation Age of Onset   Diabetes Other    Uterine cancer Mother    Heart disease Father    Diabetes Father    Hypertension Father    Hypertension Sister     Past Surgical History:  Procedure Laterality Date   COLONOSCOPY N/A 12/02/2014   Procedure: COLONOSCOPY;  Surgeon: Garlan Fair, MD;  Location: [facility];  Service: Endoscopy;  Laterality: N/A;   HAND SURGERY     KNEE SURGERY     Social History   Occupational History   Not on file  Tobacco Use   Smoking status: Never   Smokeless tobacco: Never  Substance and Sexual Activity   Alcohol use: No   Drug use: No   Sexual activity: Not on file

## 2021-06-04 NOTE — Telephone Encounter (Signed)
Noted  

## 2021-06-13 ENCOUNTER — Encounter: Payer: Self-pay | Admitting: Emergency Medicine

## 2021-06-13 ENCOUNTER — Other Ambulatory Visit: Payer: Self-pay

## 2021-06-13 ENCOUNTER — Emergency Department: Payer: BC Managed Care – PPO

## 2021-06-13 DIAGNOSIS — Z794 Long term (current) use of insulin: Secondary | ICD-10-CM | POA: Insufficient documentation

## 2021-06-13 DIAGNOSIS — S76312A Strain of muscle, fascia and tendon of the posterior muscle group at thigh level, left thigh, initial encounter: Secondary | ICD-10-CM | POA: Insufficient documentation

## 2021-06-13 DIAGNOSIS — Z7982 Long term (current) use of aspirin: Secondary | ICD-10-CM | POA: Insufficient documentation

## 2021-06-13 DIAGNOSIS — Y9317 Activity, water skiing and wake boarding: Secondary | ICD-10-CM | POA: Diagnosis not present

## 2021-06-13 DIAGNOSIS — Z79899 Other long term (current) drug therapy: Secondary | ICD-10-CM | POA: Insufficient documentation

## 2021-06-13 DIAGNOSIS — Z7984 Long term (current) use of oral hypoglycemic drugs: Secondary | ICD-10-CM | POA: Insufficient documentation

## 2021-06-13 DIAGNOSIS — W19XXXA Unspecified fall, initial encounter: Secondary | ICD-10-CM | POA: Insufficient documentation

## 2021-06-13 DIAGNOSIS — E785 Hyperlipidemia, unspecified: Secondary | ICD-10-CM | POA: Insufficient documentation

## 2021-06-13 DIAGNOSIS — Z85038 Personal history of other malignant neoplasm of large intestine: Secondary | ICD-10-CM | POA: Diagnosis not present

## 2021-06-13 DIAGNOSIS — E113393 Type 2 diabetes mellitus with moderate nonproliferative diabetic retinopathy without macular edema, bilateral: Secondary | ICD-10-CM | POA: Diagnosis not present

## 2021-06-13 DIAGNOSIS — E1169 Type 2 diabetes mellitus with other specified complication: Secondary | ICD-10-CM | POA: Diagnosis not present

## 2021-06-13 DIAGNOSIS — S8992XA Unspecified injury of left lower leg, initial encounter: Secondary | ICD-10-CM | POA: Diagnosis present

## 2021-06-13 NOTE — ED Triage Notes (Signed)
Pt reports that he was water skiing today and feels like he tore his hamstring. Pt states the only relief he gets is if he pulls up on left upper leg.

## 2021-06-14 ENCOUNTER — Emergency Department
Admission: EM | Admit: 2021-06-14 | Discharge: 2021-06-14 | Disposition: A | Discharge status: Home / Self Care | Payer: BC Managed Care – PPO | Attending: Emergency Medicine | Admitting: Emergency Medicine

## 2021-06-14 DIAGNOSIS — S76302A Unspecified injury of muscle, fascia and tendon of the posterior muscle group at thigh level, left thigh, initial encounter: Secondary | ICD-10-CM

## 2021-06-14 MED ORDER — OXYCODONE-ACETAMINOPHEN 5-325 MG PO TABS
2.0000 | ORAL_TABLET | Freq: Once | ORAL | Status: AC
  Administered 2021-06-14: 2 via ORAL
  Filled 2021-06-14: qty 2

## 2021-06-14 MED ORDER — KETOROLAC TROMETHAMINE 30 MG/ML IJ SOLN
30.0000 mg | Freq: Once | INTRAMUSCULAR | Status: AC
  Administered 2021-06-14: 30 mg via INTRAMUSCULAR
  Filled 2021-06-14: qty 1

## 2021-06-14 NOTE — ED Provider Notes (Signed)
[facility] Emergency Department Provider Note  ____________________________________________   Event Date/Time   First MD Initiated Contact with Patient 06/14/21 [phone]     (approximate)  I have reviewed the triage vital signs and the nursing notes.   HISTORY  Chief Complaint Leg Injury    HPI Louis Joseph is a 60 y.o. male with medical history as listed below who presents for evaluation of injury to the back of his left thigh.  He was waterskiing this afternoon and took a fall at which point he had a cute onset of severe pain in his left upper thigh.  He feels better when he flexes his knee and worse if he extends his leg.  No numbness or tingling.  No swelling.  He believes he pulled his hamstring.  No other injuries.  He does not take blood thinners.     Past Medical History:  Diagnosis Date   Colon adenomas    Diabetes mellitus without complication ([place])    DM w/o complication type II ([place]) 03/19/2015   Dyslipidemia 03/19/2015   GERD (gastroesophageal reflux disease) 03/19/2015   Gilbert's syndrome    Hearing loss    s/p hearing aides   Osteopenia    PUD (peptic ulcer disease)     Patient Active Problem List   Diagnosis Date Noted   Moderate nonproliferative diabetic retinopathy of both eyes ([place]) 11/03/2020   Nuclear sclerotic cataract of both eyes 11/03/2020   Retinal microaneurysm of both eyes 11/03/2020   Acute colitis 09/08/2015   Fever 09/06/2015   Nausea vomiting and diarrhea 09/06/2015   Headache 09/06/2015   Sepsis ([place]) 09/06/2015   AKI (acute kidney injury) ([place]) 09/06/2015   Bad headache    Dyslipidemia 03/19/2015   GERD (gastroesophageal reflux disease) 03/19/2015   SOB (shortness of breath) 95/63/8756   DM w/o complication type II ([place]) 03/19/2015   Gilbert's syndrome    Osteopenia    PUD (peptic ulcer disease)    Colon adenomas    Hearing loss     Past Surgical History:  Procedure Laterality Date    COLONOSCOPY N/A 12/02/2014   Procedure: COLONOSCOPY;  Surgeon: Garlan Fair, MD;  [facility];  Service: Endoscopy;  Laterality: N/A;   HAND SURGERY     KNEE SURGERY      Prior to Admission medications   Medication Sig Start Date End Date Taking? Authorizing Provider  aspirin 81 MG tablet Take 81 mg by mouth daily. Pt takes 1 tablet 4 times a week    [provider]  CALCIUM-VITAMIN D PO Take 1 tablet by mouth 2 (two) times daily.    [provider]  cephALEXin (KEFLEX) 500 MG capsule Take 1 capsule (500 mg total) by mouth 4 (four) times daily. Take all of medicine and drink lots of fluids 07/14/16   Billy Fischer, MD  ciprofloxacin (CIPRO) 500 MG tablet Take 1 tablet (500 mg total) by mouth 2 (two) times daily. 09/08/15   Donne Hazel, MD  HYDROcodone-acetaminophen (NORCO/VICODIN) 5-325 MG per tablet Take 1-2 tablets by mouth every 4 (four) hours as needed for moderate pain. 09/08/15   Donne Hazel, MD  Insulin Human (INSULIN PUMP) 100 unit/ml SOLN Inject into the skin.    [provider]  Lansoprazole (PREVACID 24HR PO) Take 1 capsule by mouth daily.     [provider]  metFORMIN (GLUCOPHAGE) 1000 MG tablet Take 1,000 mg by mouth 2 (two) times daily. 03/17/15  [provider]  metroNIDAZOLE (FLAGYL) 500 MG tablet Take 1 tablet (500 mg total) by mouth 3 (three) times daily. 09/08/15   Donne Hazel, MD  pantoprazole (PROTONIX) 40 MG tablet Take 40 mg by mouth 2 (two) times daily. 03/12/15   [provider]  pravastatin (PRAVACHOL) 40 MG tablet Take 20 mg by mouth daily. Pt takes 1/2 tablet by mouth in the evening. 02/13/15   [provider]  saccharomyces boulardii (FLORASTOR) 250 MG capsule Take 1 capsule (250 mg total) by mouth 2 (two) times daily. 09/08/15   Donne Hazel, MD  traMADol (ULTRAM) 50 MG tablet Take 1 tablet (50 mg total) by mouth every 6 (six) hours as needed for moderate pain. 07/14/16   Billy Fischer, MD    Allergies Codeine  Family History  Problem Relation Age of Onset   Diabetes Other    Uterine cancer Mother    Heart disease Father    Diabetes Father    Hypertension Father    Hypertension Sister     Social History Social History   Tobacco Use   Smoking status: Never   Smokeless tobacco: Never  Substance Use Topics   Alcohol use: No   Drug use: No    Review of Systems Constitutional: No fever/chills Eyes: No visual changes. Cardiovascular: Denies chest pain. Respiratory: Denies shortness of breath. Genitourinary: Negative for dysuria. Musculoskeletal: Pain in the posterior left upper leg as described above. Neurological: Negative for headaches, focal weakness or numbness.   ____________________________________________   PHYSICAL EXAM:  VITAL SIGNS: ED Triage Vitals  Enc Vitals Group     BP 06/13/21 2236 138/87     Pulse Rate 06/13/21 2236 73     Resp 06/13/21 2236 20     Temp 06/13/21 2236 98.1 F (36.7 C)     Temp Source 06/13/21 2236 Oral     SpO2 06/13/21 2236 97 %     Weight 06/13/21 2237 90.7 kg (200 lb)     Height 06/13/21 2237 1.803 m (5\' 11" )     Head Circumference --      Peak Flow --      Pain Score 06/13/21 2236 9     Pain Loc --      Pain Edu? --      Excl. in [place]? --     Constitutional: Alert and oriented.  Eyes: Conjunctivae are normal.  Head: Atraumatic. Cardiovascular: Normal rate, regular rhythm. Good peripheral circulation. Respiratory: Normal respiratory effort.  No retractions. Musculoskeletal: Patient feels relief when holding his hip and knee in flexion.  He has tenderness to palpation in the posterior upper thigh rate at the base of his buttocks.  No palpable deformities, no palpable hematomas nor visible ecchymosis.  Compartments are soft and easily compressible. Neurologic:  Normal speech and language. No gross focal neurologic deficits are appreciated.  Skin:  Skin is warm, dry and intact. Psychiatric: Mood  and affect are normal. Speech and behavior are normal.  ____________________________________________    RADIOLOGY I, Hinda Kehr, personally viewed and evaluated these images (plain radiographs) as part of my medical decision making, as well as reviewing the written report by the radiologist.  ED MD interpretation: No gross abnormalities identified on x-ray  Official radiology report(s): DG Femur Min 2 Views Left  Result Date: 06/14/2021 CLINICAL DATA:  Water skiing accident, left leg pain EXAM: LEFT FEMUR 2 VIEWS COMPARISON:  None. FINDINGS: There is no evidence of fracture or other focal bone lesions.  Soft tissues are unremarkable. IMPRESSION: Negative. Electronically Signed   By: Fidela Salisbury MD   On: 06/14/2021 01:26    ____________________________________________    INITIAL IMPRESSION / MDM / [place] / ED COURSE  As part of my medical decision making, I reviewed the following data within the [facility] notes reviewed and incorporated, Old chart reviewed, Radiograph reviewed , Notes from prior ED visits, and [place] Controlled Substance Database   Differential diagnosis includes, but is not limited to, partial or complete hamstring injury, piriformis syndrome, hematoma, femur fracture.  I personally reviewed the patient's imaging and agree with the radiologist's interpretation that there is no evidence of bony abnormality and no obvious disruption of the ligaments and tendons that sometimes show up on plain films.  However the patient clinically has a soft tissue injury.  We talked about RICE and we provided crutches and Ace wrap that includes the knee all the way up to the top of the thigh.  He will follow-up with orthopedics.  Medications as listed below.  I gave my usual and customary return precautions.           ____________________________________________  FINAL CLINICAL IMPRESSION(S) / ED DIAGNOSES  Final diagnoses:  Hamstring  injury, left, initial encounter     MEDICATIONS GIVEN DURING THIS VISIT:  Medications  ketorolac (TORADOL) 30 MG/ML injection 30 mg (30 mg Intramuscular Given 06/14/21 0153)  oxyCODONE-acetaminophen (PERCOCET/ROXICET) 5-325 MG per tablet 2 tablet (2 tablets Oral Given 06/14/21 0153)     ED Discharge Orders     None        Note:  This document was prepared using Dragon voice recognition software and may include unintentional dictation errors.   Hinda Kehr, MD 06/14/21 [phone]

## 2021-06-14 NOTE — ED Notes (Signed)
NAD noted at time of D/C. Pt ambulatory to car per his request. D/C into the care of his wife. Pt denies comments/concerns regarding D/C instructions. Verbal consent for D/C obtained.

## 2021-06-15 ENCOUNTER — Encounter: Payer: Self-pay | Admitting: Orthopaedic Surgery

## 2021-06-15 ENCOUNTER — Ambulatory Visit: Payer: BC Managed Care – PPO | Admitting: Orthopaedic Surgery

## 2021-06-15 ENCOUNTER — Other Ambulatory Visit: Payer: Self-pay

## 2021-06-15 DIAGNOSIS — S76312A Strain of muscle, fascia and tendon of the posterior muscle group at thigh level, left thigh, initial encounter: Secondary | ICD-10-CM | POA: Diagnosis not present

## 2021-06-15 MED ORDER — NABUMETONE 750 MG PO TABS: 750.0000 mg | ORAL_TABLET | Freq: Two times a day (BID) | ORAL | 1 refills | Status: DC | PRN

## 2021-06-15 MED ORDER — OXYCODONE HCL 5 MG PO TABS: 5.0000 mg | ORAL_TABLET | Freq: Four times a day (QID) | ORAL | 0 refills | Status: DC | PRN

## 2021-06-15 MED ORDER — TIZANIDINE HCL 4 MG PO TABS: 4.0000 mg | ORAL_TABLET | Freq: Three times a day (TID) | ORAL | 0 refills | Status: AC | PRN

## 2021-06-15 NOTE — Progress Notes (Signed)
Office Visit Note   Patient: Louis Joseph           Date of Birth: 1961-03-07           MRN: [id number] Visit Date: 06/15/2021              Requested by: Crist Infante, MD [address] [place],  [place] [address] PCP: Crist Infante, MD   Assessment & Plan: Visit Diagnoses:  1. Left proximal hamstring tendon rupture, initial encounter     Plan: Based on his clinical exam as well as signs and symptoms, a he does seem to have an acute rupture of his left hamstrings tendon.  I have recommended him trying to find some type of compressive sleeve for the thigh which will help.  We do have Ace wrap's that we can wrap around this area and have recommended alternating ice and heat and even considering Voltaren gel.  He may end up needing some physical therapy, but for now, we need him to rest his hamstring.  I will send in the combination of the muscle relaxant, a pain medication, and a anti-inflammatory.  I will see him back in 2 weeks to see how he is doing overall.  All question concerns were answered and addressed.  Follow-Up Instructions: Return in about 2 weeks (around 06/29/2021).   Orders:  No orders of the defined types were placed in this encounter.  Meds ordered this encounter  Medications   oxyCODONE (ROXICODONE) 5 MG immediate release tablet    Sig: Take 1-2 tablets (5-10 mg total) by mouth every 6 (six) hours as needed for severe pain.    Dispense:  30 tablet    Refill:  0   tiZANidine (ZANAFLEX) 4 MG tablet    Sig: Take 1 tablet (4 mg total) by mouth every 8 (eight) hours as needed for muscle spasms.    Dispense:  30 tablet    Refill:  0   nabumetone (RELAFEN) 750 MG tablet    Sig: Take 1 tablet (750 mg total) by mouth 2 (two) times daily as needed.    Dispense:  60 tablet    Refill:  1       Procedures: No procedures performed   Clinical Data: No additional findings.   Subjective: Chief Complaint  Patient presents with   Left Leg - Pain  The patient is  a 60 year old gentleman I just seen recently for his knee.  He was waterskiing yesterday and sustained an injury to his left hamstring area while he was waterskiing.  He had a severe acute pain after feeling a pop in his upper hamstring area and he went to the emergency room.  X-rays were negative for any type of fracture.  He has been on crutches and having to keep his hip in a flexed position in his knee flexed due to the severe pain in his left hamstring area.  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  He does note that the steroid injection I placed in his knee recently did significantly elevate his blood glucose.  He is a diabetic on insulin.  HPI  Review of Systems There is currently listed no headache, chest pain, shortness of breath, fever, chills, nausea, vomiting  Objective: Vital Signs: There were no vitals taken for this visit.  Physical Exam He is alert and orient x3 and in no acute distress but obvious discomfort Ortho Exam Examination of his left thigh does show bruising posteriorly near the insertion  of the hamstring tendon.  He is incredibly tender along the palpation of the mid to proximal hamstring on the left side. Specialty Comments:  No specialty comments available.  Imaging: No results found. Independent review of x-rays of the left femur show no acute findings.  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   Acute colitis 09/08/2015   Fever 09/06/2015   Nausea vomiting and diarrhea 09/06/2015   Headache 09/06/2015   Sepsis ([place]) 09/06/2015   AKI (acute kidney injury) ([place]) 09/06/2015   Bad headache    Dyslipidemia 03/19/2015   GERD (gastroesophageal reflux disease) 03/19/2015   SOB (shortness of breath) 47/65/4650   DM w/o complication type II ([facility])  03/19/2015   Gilbert's syndrome    Osteopenia    PUD (peptic ulcer disease)    Colon adenomas    Hearing loss    Past Medical History:  Diagnosis Date   Colon adenomas    Diabetes mellitus without complication ([place])    DM w/o complication type II ([facility]) 03/19/2015   Dyslipidemia 03/19/2015   GERD (gastroesophageal reflux disease) 03/19/2015   Gilbert's syndrome    Hearing loss    s/p hearing aides   Osteopenia    PUD (peptic ulcer disease)     Family History  Problem Relation Age of Onset   Diabetes Other    Uterine cancer Mother    Heart disease Father    Diabetes Father    Hypertension Father    Hypertension Sister     Past Surgical History:  Procedure Laterality Date   COLONOSCOPY N/A 12/02/2014   Procedure: COLONOSCOPY;  Surgeon: Garlan Fair, MD;  Location: [facility];  Service: Endoscopy;  Laterality: N/A;   HAND SURGERY     KNEE SURGERY     Social History   Occupational History   Not on file  Tobacco Use   Smoking status: Never   Smokeless tobacco: Never  Substance and Sexual Activity   Alcohol use: No   Drug use: No   Sexual activity: Not on file

## 2021-06-29 ENCOUNTER — Ambulatory Visit: Payer: BC Managed Care – PPO | Admitting: Orthopaedic Surgery

## 2021-06-30 ENCOUNTER — Telehealth: Payer: Self-pay

## 2021-06-30 NOTE — Telephone Encounter (Signed)
VOB has been submitted for SynviscOne, left knee. Pending BV. 

## 2021-07-03 ENCOUNTER — Telehealth: Payer: Self-pay

## 2021-07-03 NOTE — Telephone Encounter (Signed)
PA submitted through Covermymeds for SynviscOne, left knee. Pending PA# [id number]

## 2021-07-07 ENCOUNTER — Telehealth: Payer: Self-pay

## 2021-07-07 NOTE — Telephone Encounter (Signed)
Approved for SynviscOne, left knee. Buy & Bill Covered at 100% after Co-pay Co-pay of $80.00-$160.00 PA Approval# [id number] Valid 07/03/2021- 12/29/2021

## 2021-07-08 ENCOUNTER — Ambulatory Visit: Payer: BC Managed Care – PPO | Admitting: Orthopaedic Surgery

## 2021-07-08 ENCOUNTER — Other Ambulatory Visit: Payer: Self-pay

## 2021-07-08 ENCOUNTER — Encounter: Payer: Self-pay | Admitting: Orthopaedic Surgery

## 2021-07-08 DIAGNOSIS — M1712 Unilateral primary osteoarthritis, left knee: Secondary | ICD-10-CM | POA: Diagnosis not present

## 2021-07-08 MED ORDER — HYLAN G-F 20 48 MG/6ML IX SOSY
48.0000 mg | PREFILLED_SYRINGE | INTRA_ARTICULAR | Status: AC | PRN
  Administered 2021-07-08: 48 mg via INTRA_ARTICULAR

## 2021-07-08 MED ORDER — LIDOCAINE HCL 1 % IJ SOLN
3.0000 mL | INTRAMUSCULAR | Status: AC | PRN
  Administered 2021-07-08: 3 mL

## 2021-07-08 NOTE — Progress Notes (Signed)
   Procedure Note  Patient: Louis Joseph             Date of Birth: [birth year]           MRN: [id number]             Visit Date: 07/08/2021  HPI: Louis Joseph comes in today for Synvisc 1 injection left knee.  He has known osteoarthritis of the left knee.  Radiographs of his left knee show tricompartmental changes with varus malalignment.  Osteophytes in the medial and patellofemoral compartments are seen.  Patient is failed conservative treatment.  He has no upcoming knee surgery planned in the next 6 months.  Physical exam: Left knee full extension flexion is full.  No abnormal warmth erythema or effusion.  Procedures: Visit Diagnoses:  1. Unilateral primary osteoarthritis, left knee     Large Joint Inj: L knee on 07/08/2021 5:03 PM Indications: pain Details: 22 G 1.5 in needle, superolateral approach  Arthrogram: No  Medications: 3 mL lidocaine 1 %; 48 mg Hylan 48 MG/6ML Outcome: tolerated well, no immediate complications Procedure, treatment alternatives, risks and benefits explained, specific risks discussed. Consent was given by the patient. Immediately prior to procedure a time out was called to verify the correct patient, procedure, equipment, support staff and site/side marked as required. Patient was prepped and draped in the usual sterile fashion.     Plan: See him back in 8 weeks to see how he responded to the Synvisc 1 injection.  Also to see him back for his hamstring pull.  Questions were encouraged and answered.

## 2021-09-02 ENCOUNTER — Other Ambulatory Visit: Payer: Self-pay

## 2021-09-02 ENCOUNTER — Ambulatory Visit: Payer: BC Managed Care – PPO | Admitting: Orthopaedic Surgery

## 2021-09-02 ENCOUNTER — Encounter: Payer: Self-pay | Admitting: Orthopaedic Surgery

## 2021-09-02 DIAGNOSIS — G8929 Other chronic pain: Secondary | ICD-10-CM

## 2021-09-02 DIAGNOSIS — M25562 Pain in left knee: Secondary | ICD-10-CM | POA: Diagnosis not present

## 2021-09-02 MED ORDER — LIDOCAINE HCL 1 % IJ SOLN
3.0000 mL | INTRAMUSCULAR | Status: AC | PRN
  Administered 2021-09-02: 3 mL

## 2021-09-02 MED ORDER — METHYLPREDNISOLONE ACETATE 40 MG/ML IJ SUSP
40.0000 mg | INTRAMUSCULAR | Status: AC | PRN
  Administered 2021-09-02: 40 mg via INTRA_ARTICULAR

## 2021-09-02 NOTE — Progress Notes (Signed)
Office Visit Note   Patient: Louis Joseph           Date of Birth: 1961/09/08           MRN: [id number] Visit Date: 09/02/2021              Requested by: Crist Infante, MD [address] [place],  [place] [address] PCP: Crist Infante, MD   Assessment & Plan: Visit Diagnoses:  1. Chronic pain of left knee     Plan: We talked in length in detail about treatment options.  1 option is a knee replacement at this standpoint given very conservative treatment.  He said he is not really ready for that at all yet.  I did offer him a steroid injection in his knee today and he agreed to this and tolerated it well.  He will watch his blood glucose closely.  All questions and concerns were answered addressed.  Follow-up can be as needed.  Follow-Up Instructions: Return if symptoms worsen or fail to improve.   Orders:  Orders Placed This Encounter  Procedures   Large Joint Inj   No orders of the defined types were placed in this encounter.     Procedures: Large Joint Inj: L knee on 09/02/2021 4:25 PM Indications: diagnostic evaluation and pain Details: 22 G 1.5 in needle, superolateral approach  Arthrogram: No  Medications: 3 mL lidocaine 1 %; 40 mg methylPREDNISolone acetate 40 MG/ML Outcome: tolerated well, no immediate complications Procedure, treatment alternatives, risks and benefits explained, specific risks discussed. Consent was given by the patient. Immediately prior to procedure a time out was called to verify the correct patient, procedure, equipment, support staff and site/side marked as required. Patient was prepped and draped in the usual sterile fashion.      Clinical Data: No additional findings.   Subjective: Chief Complaint  Patient presents with   Left Knee - Follow-up  The patient is well-known to me.  He does have significant arthritis in his left knee.  He still has daily pain with that knee and recently he felt like it there was some swelling after  playing golf.  It hurts him worse at night.  We have tried conservative treatment including a hyaluronic acid injection with Synvisc back in July.  This has not really helped.  Again he does have a constant ache at night.  He has had no recent injuries.  He had a previous left knee arthroscopy about 23 years ago.  Previous x-rays of his left knee show tricompartment arthritis with varus malalignment.  He is a diabetic.  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  HPI  Review of Systems There is currently listed no headache, chest pain, shortness of breath, fever, chills, nausea, vomiting  Objective: Vital Signs: There were no vitals taken for this visit.  Physical Exam He is alert and orient x3 and in no acute distress Ortho Exam Examination of his left knee shows global tenderness with varus malalignment and excellent range of motion.  There is no effusion today.  There is medial joint line tenderness. Specialty Comments:  No specialty comments available.  Imaging: No results found.   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/03/2020   Acute colitis 09/08/2015   Fever 09/06/2015   Nausea vomiting and diarrhea 09/06/2015  Headache 09/06/2015   Sepsis ([facility]) 09/06/2015   AKI (acute kidney injury) ([facility]) 09/06/2015   Bad headache    Dyslipidemia 03/19/2015   GERD (gastroesophageal reflux disease) 03/19/2015   SOB (shortness of breath) [id number]   DM w/o complication type II ([place]) 03/19/2015   Gilbert's syndrome    Osteopenia    PUD (peptic ulcer disease)    Colon adenomas    Hearing loss    Past Medical History:  Diagnosis Date   Colon adenomas    Diabetes mellitus without complication ([facility])    DM w/o complication type II ([facility] [place]) 03/19/2015   Dyslipidemia 03/19/2015   GERD  (gastroesophageal reflux disease) 03/19/2015   Gilbert's syndrome    Hearing loss    s/p hearing aides   Osteopenia    PUD (peptic ulcer disease)     Family History  Problem Relation Age of Onset   Diabetes Other    Uterine cancer Mother    Heart disease Father    Diabetes Father    Hypertension Father    Hypertension Sister     Past Surgical History:  Procedure Laterality Date   COLONOSCOPY N/A 12/02/2014   Procedure: COLONOSCOPY;  Surgeon: Garlan Fair, MD;  Location: [facility];  Service: Endoscopy;  Laterality: N/A;   HAND SURGERY     KNEE SURGERY     Social History   Occupational History   Not on file  Tobacco Use   Smoking status: Never   Smokeless tobacco: Never  Substance and Sexual Activity   Alcohol use: No   Drug use: No   Sexual activity: Not on file

## 2021-11-03 ENCOUNTER — Ambulatory Visit (INDEPENDENT_AMBULATORY_CARE_PROVIDER_SITE_OTHER): Payer: BC Managed Care – PPO | Admitting: Ophthalmology

## 2021-11-03 ENCOUNTER — Encounter (INDEPENDENT_AMBULATORY_CARE_PROVIDER_SITE_OTHER): Payer: Self-pay | Admitting: Ophthalmology

## 2021-11-03 ENCOUNTER — Other Ambulatory Visit: Payer: Self-pay

## 2021-11-03 DIAGNOSIS — E113393 Type 2 diabetes mellitus with moderate nonproliferative diabetic retinopathy without macular edema, bilateral: Secondary | ICD-10-CM | POA: Diagnosis not present

## 2021-11-03 DIAGNOSIS — H2513 Age-related nuclear cataract, bilateral: Secondary | ICD-10-CM | POA: Diagnosis not present

## 2021-11-03 NOTE — Assessment & Plan Note (Signed)

## 2021-11-03 NOTE — Assessment & Plan Note (Signed)
Only mild to moderate NPDR OU, no signs of progression

## 2021-11-03 NOTE — Progress Notes (Signed)
11/03/2021     CHIEF COMPLAINT Patient presents for  Chief Complaint  Patient presents with   Retina Follow Up      HISTORY OF PRESENT ILLNESS: Louis Joseph is a 60 y.o. male who presents to the clinic today for:   HPI     Retina Follow Up   Patient presents with  Diabetic Retinopathy.  In both eyes.  This started 1 year ago.  Duration of 1 year.  Since onset it is stable.        Comments   1 year f/u OU with FP.  Pt c/o having increased difficulty with low light settings and his vision.       Last edited by Reather Littler, COA on 11/03/2021 12:53 PM.      Referring physician: Crist Infante, MD [place],  [place] [id number]  HISTORICAL INFORMATION:   Selected notes from the MEDICAL RECORD NUMBER    Lab Results  Component Value Date   HGBA1C 7.3 (H) 09/06/2015     CURRENT MEDICATIONS: No current outpatient medications on file. (Ophthalmic Drugs)   No current facility-administered medications for this visit. (Ophthalmic Drugs)   Current Outpatient Medications (Other)  Medication Sig   aspirin 81 MG tablet Take 81 mg by mouth daily. Pt takes 1 tablet 4 times a week   CALCIUM-VITAMIN D PO Take 1 tablet by mouth 2 (two) times daily.   cephALEXin (KEFLEX) 500 MG capsule Take 1 capsule (500 mg total) by mouth 4 (four) times daily. Take all of medicine and drink lots of fluids   ciprofloxacin (CIPRO) 500 MG tablet Take 1 tablet (500 mg total) by mouth 2 (two) times daily.   HYDROcodone-acetaminophen (NORCO/VICODIN) 5-325 MG per tablet Take 1-2 tablets by mouth every 4 (four) hours as needed for moderate pain.   Insulin Human (INSULIN PUMP) 100 unit/ml SOLN Inject into the skin.   Lansoprazole (PREVACID 24HR PO) Take 1 capsule by mouth daily.    metFORMIN (GLUCOPHAGE) 1000 MG tablet Take 1,000 mg by mouth 2 (two) times daily.   metroNIDAZOLE (FLAGYL) 500 MG tablet Take 1 tablet (500 mg total) by mouth 3 (three) times daily.   nabumetone  (RELAFEN) 750 MG tablet Take 1 tablet (750 mg total) by mouth 2 (two) times daily as needed.   oxyCODONE (ROXICODONE) 5 MG immediate release tablet Take 1-2 tablets (5-10 mg total) by mouth every 6 (six) hours as needed for severe pain.   pantoprazole (PROTONIX) 40 MG tablet Take 40 mg by mouth 2 (two) times daily.   pravastatin (PRAVACHOL) 40 MG tablet Take 20 mg by mouth daily. Pt takes 1/2 tablet by mouth in the evening.   saccharomyces boulardii (FLORASTOR) 250 MG capsule Take 1 capsule (250 mg total) by mouth 2 (two) times daily.   tiZANidine (ZANAFLEX) 4 MG tablet Take 1 tablet (4 mg total) by mouth every 8 (eight) hours as needed for muscle spasms.   traMADol (ULTRAM) 50 MG tablet Take 1 tablet (50 mg total) by mouth every 6 (six) hours as needed for moderate pain.   No current facility-administered medications for this visit. (Other)      REVIEW OF SYSTEMS:    ALLERGIES Allergies  Allergen Reactions   Codeine Nausea And Vomiting    PAST MEDICAL HISTORY Past Medical History:  Diagnosis Date   Colon adenomas    Diabetes mellitus without complication ([place])    DM w/o complication type II ([place]) 03/19/2015   Dyslipidemia 03/19/2015  GERD (gastroesophageal reflux disease) 03/19/2015   Gilbert's syndrome    Hearing loss    s/p hearing aides   Osteopenia    PUD (peptic ulcer disease)    Past Surgical History:  Procedure Laterality Date   COLONOSCOPY N/A 12/02/2014   Procedure: COLONOSCOPY;  Surgeon: Garlan Fair, MD;  Location: [facility];  Service: Endoscopy;  Laterality: N/A;   HAND SURGERY     KNEE SURGERY      FAMILY HISTORY Family History  Problem Relation Age of Onset   Diabetes Other    Uterine cancer Mother    Heart disease Father    Diabetes Father    Hypertension Father    Hypertension Sister     SOCIAL HISTORY Social History   Tobacco Use   Smoking status: Never   Smokeless tobacco: Never  Substance Use Topics   Alcohol use: No   Drug use:  No         OPHTHALMIC EXAM:  Base Eye Exam     Visual Acuity (ETDRS)       Right Left   Dist  20/20 20/20         Tonometry (Tonopen, 12:59 PM)       Right Left   Pressure 12 10         Pupils       Pupils Dark Light Shape React APD   Right PERRL 4 3 Round Brisk None   Left PERRL 4 3 Round Brisk None         Visual Fields (Counting fingers)       Left Right    Full Full         Extraocular Movement       Right Left    Full, Ortho Full, Ortho         Neuro/Psych     Oriented x3: Yes   Mood/Affect: Normal         Dilation     Both eyes: 1.0% Mydriacyl, 2.5% Phenylephrine @ 12:59 PM           Slit Lamp and Fundus Exam     External Exam       Right Left   External Normal Normal         Slit Lamp Exam       Right Left   Lids/Lashes Normal Normal   Conjunctiva/Sclera White and quiet White and quiet   Cornea Clear Clear   Anterior Chamber Deep and quiet Deep and quiet   Iris Round and reactive Round and reactive   Lens 2+ Nuclear sclerosis 2+ Nuclear sclerosis   Anterior Vitreous Normal Normal         Fundus Exam       Right Left   Posterior Vitreous Normal Normal   Disc Normal Normal   C/D Ratio 0.4 0.4   Macula no macular thickening, Microaneurysms no macular thickening, Microaneurysms   Vessels NPDR- Moderate NPDR- Moderate   Periphery Normal Normal            IMAGING AND PROCEDURES  Imaging and Procedures for 11/03/21  Color Fundus Photography Optos - OU - Both Eyes       Right Eye Progression has been stable. Disc findings include normal observations. Macula : normal observations. Periphery : normal observations.   Left Eye Progression has been stable. Disc findings include normal observations. Macula : normal observations. Periphery : normal observations.   Notes Mild to Moderate NPDR  ASSESSMENT/PLAN:  Moderate nonproliferative diabetic retinopathy of both eyes (HCC) Only  mild to moderate NPDR OU, no signs of progression  Nuclear sclerotic cataract of both eyes The nature of cataract was discussed with the patient as well as the elective nature of surgery. The patient was reassured that surgery at a later date does not put the patient at risk for a worse outcome. It was emphasized that the need for surgery is dictated by the patient's quality of life as influenced by the cataract. Patient was instructed to maintain close follow up with their general eye care doctor.     ICD-10-CM   1. Moderate nonproliferative diabetic retinopathy of both eyes without macular edema associated with type 2 diabetes mellitus (HCC)  G18.2993 Color Fundus Photography Optos - OU - Both Eyes    2. Nuclear sclerotic cataract of both eyes  H25.13       1.  No progression of mild to moderate NPDR OU.  2.  Having significant nighttime vision difficulties with the cataract as these become darker.  3.  We will refer for evaluation of her cataracts in each eye  Ophthalmic Meds Ordered this visit:  No orders of the defined types were placed in this encounter.      Return in about 1 year (around 11/03/2022) for DILATE OU, COLOR FP, OCT.  There are no Patient Instructions on file for this visit.   Explained the diagnoses, plan, and follow up with the patient and they expressed understanding.  Patient expressed understanding of the importance of proper follow up care.   Clent Demark Syncere Eble M.D. Diseases & Surgery of the Retina and Vitreous [facility] 11/03/21     Abbreviations: M myopia (nearsighted); A astigmatism; H hyperopia (farsighted); P presbyopia; Mrx spectacle prescription;  CTL contact lenses; OD right eye; OS left eye; OU both eyes  XT exotropia; ET esotropia; PEK punctate epithelial keratitis; PEE punctate epithelial erosions; DES dry eye syndrome; MGD meibomian gland dysfunction; ATs artificial tears; PFAT's preservative free artificial tears; [place]  nuclear sclerotic cataract; PSC posterior subcapsular cataract; ERM epi-retinal membrane; PVD posterior vitreous detachment; RD retinal detachment; DM diabetes mellitus; DR diabetic retinopathy; NPDR non-proliferative diabetic retinopathy; PDR proliferative diabetic retinopathy; CSME clinically significant macular edema; DME diabetic macular edema; dbh dot blot hemorrhages; CWS cotton wool spot; POAG primary open angle glaucoma; C/D cup-to-disc ratio; HVF humphrey visual field; GVF goldmann visual field; OCT optical coherence tomography; IOP intraocular pressure; BRVO Branch retinal vein occlusion; CRVO central retinal vein occlusion; CRAO central retinal artery occlusion; BRAO branch retinal artery occlusion; RT retinal tear; SB scleral buckle; PPV pars plana vitrectomy; VH Vitreous hemorrhage; PRP panretinal laser photocoagulation; IVK intravitreal kenalog; VMT vitreomacular traction; MH Macular hole;  NVD neovascularization of the disc; NVE neovascularization elsewhere; AREDS age related eye disease study; ARMD age related macular degeneration; POAG primary open angle glaucoma; EBMD epithelial/anterior basement membrane dystrophy; ACIOL anterior chamber intraocular lens; IOL intraocular lens; PCIOL posterior chamber intraocular lens; Phaco/IOL phacoemulsification with intraocular lens placement; [place] photorefractive keratectomy; LASIK laser assisted in situ keratomileusis; HTN hypertension; DM diabetes mellitus; COPD chronic obstructive pulmonary disease

## 2022-01-12 ENCOUNTER — Encounter: Payer: Self-pay | Admitting: Orthopaedic Surgery

## 2022-01-12 ENCOUNTER — Ambulatory Visit: Payer: BC Managed Care – PPO | Admitting: Orthopaedic Surgery

## 2022-01-12 ENCOUNTER — Other Ambulatory Visit: Payer: Self-pay

## 2022-01-12 ENCOUNTER — Ambulatory Visit: Payer: Self-pay

## 2022-01-12 DIAGNOSIS — M1712 Unilateral primary osteoarthritis, left knee: Secondary | ICD-10-CM | POA: Diagnosis not present

## 2022-01-12 MED ORDER — METHYLPREDNISOLONE ACETATE 40 MG/ML IJ SUSP
40.0000 mg | INTRAMUSCULAR | Status: AC | PRN
  Administered 2022-01-12: 10:00:00 40 mg via INTRA_ARTICULAR

## 2022-01-12 MED ORDER — LIDOCAINE HCL 1 % IJ SOLN
3.0000 mL | INTRAMUSCULAR | Status: AC | PRN
  Administered 2022-01-12: 10:00:00 3 mL

## 2022-01-12 NOTE — Progress Notes (Signed)
Office Visit Note   Patient: DEMARRIO Joseph           Date of Birth: Aug 10, 1961           MRN: [id number] Visit Date: 01/12/2022              Requested by: Louis Infante, MD [address] [place],  [place] [address] PCP: Louis Infante, MD   Assessment & Plan: Visit Diagnoses:  1. Primary osteoarthritis of left knee     Plan: He needs to wait at least 3 months between injections.  He is not ready at this point time replacement.  Follow-up with [place] as needed.  Questions encouraged and answered  Follow-Up Instructions: Return if symptoms worsen or fail to improve.   Orders:  Orders Placed This Encounter  Procedures   Large Joint Inj   XR Knee 1-2 Views Left   No orders of the defined types were placed in this encounter.     Procedures: Large Joint Inj: L knee on 01/12/2022 10:04 AM Indications: pain Details: 22 G 1.5 in needle, anterolateral approach  Arthrogram: No  Medications: 3 mL lidocaine 1 %; 40 mg methylPREDNISolone acetate 40 MG/ML Outcome: tolerated well, no immediate complications Procedure, treatment alternatives, risks and benefits explained, specific risks discussed. Consent was given by the patient. Immediately prior to procedure a time out was called to verify the correct patient, procedure, equipment, support staff and site/side marked as required. Patient was prepped and draped in the usual sterile fashion.      Clinical Data: No additional findings.   Subjective: Chief Complaint  Patient presents with   Left Knee - Pain    HPI  Louis Joseph returns today due to left knee pain.  He was last seen in September 22 was given gel injection left knee.  States it took about 5 to 6 weeks for the gel injection of the heart.  Knee felt great until about 1 month ago.  Then 2 weeks ago he was unloading some concrete and had increased pain in the knee.  He has had no other injuries.  He denies any fevers or chills.  Patient has known arthritis left knee.  He is  diabetic reports his last hemoglobin A1c to be 7.1  Review of Systems See HPI  Objective: Vital Signs: There were no vitals taken for this visit.  Physical Exam Constitutional:      Appearance: He is normal weight. He is not ill-appearing or diaphoretic.  Pulmonary:     Effort: Pulmonary effort is normal.  Neurological:     Mental Status: He is alert and oriented to person, place, and time.  Psychiatric:        Mood and Affect: Mood normal.    Ortho Exam Left knee full extension full flexion.  No instability valgus varus stressing.  Slight tenderness over the medial joint line.  No active warmth erythema or effusion.  Calf supple nontender.  Patellofemoral crepitus with passive range of motion. Specialty Comments:  No specialty comments available.  Imaging: XR Knee 1-2 Views Left  Result Date: 01/12/2022 left knee 2 views: Tricompartmental arthritic changes.  With severe narrowing medial joint line.  Osteophyte.  Slight varus malalignment.     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/03/2020   Acute colitis 09/08/2015  Fever 09/06/2015   Nausea vomiting and diarrhea 09/06/2015   Headache 09/06/2015   Sepsis ([place]) 09/06/2015   AKI (acute kidney injury) ([place]) 09/06/2015   Bad headache    Dyslipidemia 03/19/2015   GERD (gastroesophageal reflux disease) 03/19/2015   SOB (shortness of breath) 63/84/5364   DM w/o complication type II ([place]) 03/19/2015   Louis Joseph's syndrome    Osteopenia    PUD (peptic ulcer disease)    Colon adenomas    Hearing loss    Past Medical History:  Diagnosis Date   Colon adenomas    Diabetes mellitus without complication ([place])    DM w/o complication type II ([place]) 03/19/2015   Dyslipidemia 03/19/2015   GERD (gastroesophageal reflux disease)  03/19/2015   Louis Joseph's syndrome    Hearing loss    s/p hearing aides   Osteopenia    PUD (peptic ulcer disease)     Family History  Problem Relation Age of Onset   Diabetes Other    Uterine cancer Mother    Heart disease Father    Diabetes Father    Hypertension Father    Hypertension Sister     Past Surgical History:  Procedure Laterality Date   COLONOSCOPY N/A 12/02/2014   Procedure: COLONOSCOPY;  Surgeon: Louis Fair, MD;  Location: [facility];  Service: Endoscopy;  Laterality: N/A;   HAND SURGERY     KNEE SURGERY     Social History   Occupational History   Not on file  Tobacco Use   Smoking status: Never   Smokeless tobacco: Never  Substance and Sexual Activity   Alcohol use: No   Drug use: No   Sexual activity: Not on file

## 2022-04-21 ENCOUNTER — Ambulatory Visit: Payer: BC Managed Care – PPO | Admitting: Physician Assistant

## 2022-05-31 ENCOUNTER — Ambulatory Visit: Payer: BC Managed Care – PPO | Admitting: Physician Assistant

## 2022-05-31 ENCOUNTER — Encounter: Payer: Self-pay | Admitting: Physician Assistant

## 2022-05-31 DIAGNOSIS — M1712 Unilateral primary osteoarthritis, left knee: Secondary | ICD-10-CM

## 2022-05-31 DIAGNOSIS — M25562 Pain in left knee: Secondary | ICD-10-CM | POA: Diagnosis not present

## 2022-05-31 MED ORDER — METHYLPREDNISOLONE ACETATE 40 MG/ML IJ SUSP
40.0000 mg | INTRAMUSCULAR | Status: AC | PRN
  Administered 2022-05-31: 40 mg via INTRA_ARTICULAR

## 2022-05-31 MED ORDER — LIDOCAINE HCL 1 % IJ SOLN
3.0000 mL | INTRAMUSCULAR | Status: AC | PRN
  Administered 2022-05-31: 3 mL

## 2022-05-31 NOTE — Progress Notes (Signed)
   Procedure Note  Patient: Louis Joseph             Date of Birth: May 07, 1961           MRN: [id number]             Visit Date: 05/31/2022 HPI: Mr. Hoak comes in today for left knee pain and swelling.  Last cortisone injection 01/12/2022 helped.  He states the pain just recently returned.  He is waking him up at night.  He has known tricompartmental arthritis of his left knee.  Severe narrowing medial joint line.  He has had a new injury.  Physical exam: Left knee full extension full flexion.  No abnormal warmth erythema or effusion no instability valgus varus stressing.  Tenderness along medial joint line.  Calf supple nontender.  Procedures: Visit Diagnoses:  1. Primary osteoarthritis of left knee     Large Joint Inj: L knee on 05/31/2022 11:41 AM Indications: pain Details: 22 G 1.5 in needle, anterolateral approach  Arthrogram: No  Medications: 3 mL lidocaine 1 %; 40 mg methylPREDNISolone acetate 40 MG/ML Outcome: tolerated well, no immediate complications Procedure, treatment alternatives, risks and benefits explained, specific risks discussed. Consent was given by the patient. Immediately prior to procedure a time out was called to verify the correct patient, procedure, equipment, support staff and site/side marked as required. Patient was prepped and draped in the usual sterile fashion.    Plan: He will follow-up with [place] as needed.  He understands he needs to wait least 3 months between cortisone injections.  He is thinking about possibly undergoing a left total knee arthroplasty in December if he wishes to proceed with this he will come back and see [place] in early October so we can set him up for surgery in December.  Questions were encouraged and answered

## 2022-07-29 ENCOUNTER — Institutional Professional Consult (permissible substitution): Payer: BC Managed Care – PPO | Admitting: Neurology

## 2022-08-16 ENCOUNTER — Other Ambulatory Visit (HOSPITAL_BASED_OUTPATIENT_CLINIC_OR_DEPARTMENT_OTHER): Payer: Self-pay

## 2022-08-16 ENCOUNTER — Other Ambulatory Visit: Payer: Self-pay

## 2022-08-16 ENCOUNTER — Emergency Department (HOSPITAL_BASED_OUTPATIENT_CLINIC_OR_DEPARTMENT_OTHER)
Admission: EM | Admit: 2022-08-16 | Discharge: 2022-08-16 | Disposition: A | Discharge status: Home / Self Care | Payer: BC Managed Care – PPO | Attending: Emergency Medicine | Admitting: Emergency Medicine

## 2022-08-16 ENCOUNTER — Emergency Department (HOSPITAL_BASED_OUTPATIENT_CLINIC_OR_DEPARTMENT_OTHER): Payer: BC Managed Care – PPO

## 2022-08-16 ENCOUNTER — Encounter (HOSPITAL_BASED_OUTPATIENT_CLINIC_OR_DEPARTMENT_OTHER): Payer: Self-pay | Admitting: Emergency Medicine

## 2022-08-16 DIAGNOSIS — Z794 Long term (current) use of insulin: Secondary | ICD-10-CM | POA: Insufficient documentation

## 2022-08-16 DIAGNOSIS — M5441 Lumbago with sciatica, right side: Secondary | ICD-10-CM | POA: Diagnosis not present

## 2022-08-16 DIAGNOSIS — R1031 Right lower quadrant pain: Secondary | ICD-10-CM | POA: Diagnosis not present

## 2022-08-16 DIAGNOSIS — Z7984 Long term (current) use of oral hypoglycemic drugs: Secondary | ICD-10-CM | POA: Insufficient documentation

## 2022-08-16 DIAGNOSIS — Z7982 Long term (current) use of aspirin: Secondary | ICD-10-CM | POA: Diagnosis not present

## 2022-08-16 DIAGNOSIS — R109 Unspecified abdominal pain: Secondary | ICD-10-CM

## 2022-08-16 DIAGNOSIS — E119 Type 2 diabetes mellitus without complications: Secondary | ICD-10-CM | POA: Diagnosis not present

## 2022-08-16 DIAGNOSIS — I10 Essential (primary) hypertension: Secondary | ICD-10-CM | POA: Insufficient documentation

## 2022-08-16 DIAGNOSIS — N50811 Right testicular pain: Secondary | ICD-10-CM | POA: Insufficient documentation

## 2022-08-16 DIAGNOSIS — X500XXA Overexertion from strenuous movement or load, initial encounter: Secondary | ICD-10-CM | POA: Insufficient documentation

## 2022-08-16 DIAGNOSIS — M545 Low back pain, unspecified: Secondary | ICD-10-CM | POA: Diagnosis present

## 2022-08-16 LAB — CBC WITH DIFFERENTIAL/PLATELET
Abs Immature Granulocytes: 0.03 10*3/uL (ref 0.00–0.07)
Basophils Absolute: 0 10*3/uL (ref 0.0–0.1)
Basophils Relative: 1 %
Eosinophils Absolute: 0.1 10*3/uL (ref 0.0–0.5)
Eosinophils Relative: 2 %
HCT: 42.5 % (ref 39.0–52.0)
Hemoglobin: 15.4 g/dL (ref 13.0–17.0)
Immature Granulocytes: 0 %
Lymphocytes Relative: 18 %
Lymphs Abs: 1.6 10*3/uL (ref 0.7–4.0)
MCH: 31.4 pg (ref 26.0–34.0)
MCHC: 36.2 g/dL — ABNORMAL HIGH (ref 30.0–36.0)
MCV: 86.7 fL (ref 80.0–100.0)
Monocytes Absolute: 0.5 10*3/uL (ref 0.1–1.0)
Monocytes Relative: 6 %
Neutro Abs: 6.6 10*3/uL (ref 1.7–7.7)
Neutrophils Relative %: 73 %
Platelets: 268 10*3/uL (ref 150–400)
RBC: 4.9 MIL/uL (ref 4.22–5.81)
RDW: 11.8 % (ref 11.5–15.5)
WBC: 8.9 10*3/uL (ref 4.0–10.5)
nRBC: 0 % (ref 0.0–0.2)

## 2022-08-16 LAB — URINALYSIS, ROUTINE W REFLEX MICROSCOPIC
Bilirubin Urine: NEGATIVE
Glucose, UA: NEGATIVE mg/dL
Hgb urine dipstick: NEGATIVE
Ketones, ur: NEGATIVE mg/dL
Leukocytes,Ua: NEGATIVE
Nitrite: NEGATIVE
Specific Gravity, Urine: 1.024 (ref 1.005–1.030)
pH: 7.5 (ref 5.0–8.0)

## 2022-08-16 LAB — COMPREHENSIVE METABOLIC PANEL
ALT: 13 U/L (ref 0–44)
AST: 17 U/L (ref 15–41)
Albumin: 4.5 g/dL (ref 3.5–5.0)
Alkaline Phosphatase: 68 U/L (ref 38–126)
Anion gap: 12 (ref 5–15)
BUN: 12 mg/dL (ref 8–23)
CO2: 22 mmol/L (ref 22–32)
Calcium: 9.5 mg/dL (ref 8.9–10.3)
Chloride: 102 mmol/L (ref 98–111)
Creatinine, Ser: 0.87 mg/dL (ref 0.61–1.24)
GFR, Estimated: >60 mL/min (ref >60)
Glucose, Bld: 164 mg/dL — ABNORMAL HIGH (ref 70–99)
Potassium: 4 mmol/L (ref 3.5–5.1)
Sodium: 136 mmol/L (ref 135–145)
Total Bilirubin: 0.9 mg/dL (ref 0.3–1.2)
Total Protein: 6.8 g/dL (ref 6.5–8.1)

## 2022-08-16 MED ORDER — MORPHINE SULFATE (PF) 4 MG/ML IV SOLN
4.0000 mg | Freq: Once | INTRAVENOUS | Status: DC
  Filled 2022-08-16: qty 1

## 2022-08-16 MED ORDER — IBUPROFEN 600 MG PO TABS
600.0000 mg | ORAL_TABLET | Freq: Four times a day (QID) | ORAL | 0 refills | Status: AC | PRN
  Filled 2022-08-16: qty 30, 8d supply, fill #0

## 2022-08-16 MED ORDER — ONDANSETRON HCL 4 MG/2ML IJ SOLN
4.0000 mg | Freq: Once | INTRAMUSCULAR | Status: DC
  Filled 2022-08-16: qty 2

## 2022-08-16 MED ORDER — METHOCARBAMOL 500 MG PO TABS
500.0000 mg | ORAL_TABLET | Freq: Two times a day (BID) | ORAL | 0 refills | Status: DC
  Filled 2022-08-16: qty 20, 10d supply, fill #0

## 2022-08-16 MED ORDER — KETOROLAC TROMETHAMINE 30 MG/ML IJ SOLN: 15.0000 mg | Freq: Once | INTRAMUSCULAR | Status: DC

## 2022-08-16 NOTE — ED Triage Notes (Addendum)
Pt arrives to ED with c/o lower back pain, testicle pain, and abdominal pain. Pt reports the back pain radiates to his right side of abdomen and to his right testicle. He reports this started last week and worsened yesterday. He reports scrotal pain worsens when he stands.

## 2022-08-16 NOTE — ED Provider Notes (Signed)
[facility] DEPT Provider Note   CSN: [id number] Arrival date & time: 08/16/22  1248     History  Chief Complaint  Patient presents with   Back Pain   Testicle Pain    Louis Joseph is a 61 y.o. male.  Louis Joseph is a 61 y.o. male with a history of hypertension, diabetes, who presents to the emergency department for evaluation of low back pain that radiates into his right abdomen and right testicle.  Pain started towards the end of last week, worsening yesterday.  Patient reports that he recently went on a fishing trip and was doing some heavy lifting and movement.  He reports that the pain is worse when he stands in particular in his right testicle and scrotum.  He denies any direct injury or trauma to the right testicle or scrotum.  No flank pain, no hematuria, dysuria or urinary frequency, no fevers or chills, nausea or vomiting.  He denies any pain radiating into his legs and denies any numbness or weakness, no loss of bowel or bladder control or saddle anesthesia.  No other aggravating or alleviating factors.  The history is provided by the patient, the spouse and medical records.  Back Pain Associated symptoms: abdominal pain   Associated symptoms: no dysuria, no fever and no numbness   Testicle Pain Associated symptoms include abdominal pain.       Home Medications Prior to Admission medications   Medication Sig Start Date End Date Taking? Authorizing Provider  ibuprofen (ADVIL) 600 MG tablet Take 1 tablet (600 mg total) by mouth every 6 (six) hours as needed. 08/16/22  Yes Jacqlyn Larsen, PA-C  methocarbamol (ROBAXIN) 500 MG tablet Take 1 tablet (500 mg total) by mouth 2 (two) times daily. 08/16/22  Yes Jacqlyn Larsen, PA-C  aspirin 81 MG tablet Take 81 mg by mouth daily. Pt takes 1 tablet 4 times a week    [provider]  CALCIUM-VITAMIN D PO Take 1 tablet by mouth 2 (two) times daily.    [provider]  cephALEXin  (KEFLEX) 500 MG capsule Take 1 capsule (500 mg total) by mouth 4 (four) times daily. Take all of medicine and drink lots of fluids 07/14/16   Billy Fischer, MD  ciprofloxacin (CIPRO) 500 MG tablet Take 1 tablet (500 mg total) by mouth 2 (two) times daily. 09/08/15   Donne Hazel, MD  HYDROcodone-acetaminophen (NORCO/VICODIN) 5-325 MG per tablet Take 1-2 tablets by mouth every 4 (four) hours as needed for moderate pain. 09/08/15   Donne Hazel, MD  Insulin Human (INSULIN PUMP) 100 unit/ml SOLN Inject into the skin.    [provider]  Lansoprazole (PREVACID 24HR PO) Take 1 capsule by mouth daily.     [provider]  metFORMIN (GLUCOPHAGE) 1000 MG tablet Take 1,000 mg by mouth 2 (two) times daily. 03/17/15   [provider]  metroNIDAZOLE (FLAGYL) 500 MG tablet Take 1 tablet (500 mg total) by mouth 3 (three) times daily. 09/08/15   Donne Hazel, MD  nabumetone (RELAFEN) 750 MG tablet Take 1 tablet (750 mg total) by mouth 2 (two) times daily as needed. 06/15/21   Mcarthur Rossetti, MD  oxyCODONE (ROXICODONE) 5 MG immediate release tablet Take 1-2 tablets (5-10 mg total) by mouth every 6 (six) hours as needed for severe pain. 06/15/21   Mcarthur Rossetti, MD  pantoprazole (PROTONIX) 40 MG tablet Take 40 mg by mouth 2 (two) times daily. 03/12/15  [provider]  pravastatin (PRAVACHOL) 40 MG tablet Take 20 mg by mouth daily. Pt takes 1/2 tablet by mouth in the evening. 02/13/15   [provider]  saccharomyces boulardii (FLORASTOR) 250 MG capsule Take 1 capsule (250 mg total) by mouth 2 (two) times daily. 09/08/15   Donne Hazel, MD  tiZANidine (ZANAFLEX) 4 MG tablet Take 1 tablet (4 mg total) by mouth every 8 (eight) hours as needed for muscle spasms. 06/15/21   Mcarthur Rossetti, MD  traMADol (ULTRAM) 50 MG tablet Take 1 tablet (50 mg total) by mouth every 6 (six) hours as needed for moderate pain. 07/14/16   Billy Fischer, MD       Allergies    Codeine    Review of Systems   Review of Systems  Constitutional:  Negative for chills and fever.  Gastrointestinal:  Positive for abdominal pain. Negative for nausea and vomiting.  Genitourinary:  Positive for flank pain and testicular pain. Negative for difficulty urinating, dysuria and hematuria.  Musculoskeletal:  Positive for back pain.  Neurological:  Negative for numbness.    Physical Exam Updated Vital Signs BP (!) 148/75 (BP Location: Right Arm)   Pulse 66   Temp 98.2 F (36.8 C)   Resp 18   Ht '5\' 11"'$  (1.803 m)   Wt 90.7 kg   SpO2 100%   BMI 27.89 kg/m  Physical Exam Vitals and nursing note reviewed.  Constitutional:      General: He is not in acute distress.    Appearance: Normal appearance. He is well-developed. He is not diaphoretic.  HENT:     Head: Normocephalic and atraumatic.  Eyes:     General:        Right eye: No discharge.        Left eye: No discharge.  Cardiovascular:     Rate and Rhythm: Normal rate and regular rhythm.     Pulses: Normal pulses.     Heart sounds: Normal heart sounds.  Pulmonary:     Effort: Pulmonary effort is normal. No respiratory distress.     Breath sounds: Normal breath sounds. No wheezing or rales.     Comments: Respirations equal and unlabored, patient able to speak in full sentences, lungs clear to auscultation bilaterally  Abdominal:     General: Bowel sounds are normal. There is no distension.     Palpations: Abdomen is soft. There is no mass.     Tenderness: There is abdominal tenderness. There is no guarding.     Comments: Abdomen soft, nondistended, there is some mild right lower quadrant tenderness without guarding or rebound tenderness, no CVA tenderness bilaterally, mild right flank tenderness  Genitourinary:    Comments: Chaperone present during genital exam. Penis normal without swelling, deformity or discharge Right testicle is mildly swollen and tender to palpation, no overlying skin  changes over the scrotum, no palpable masses bilaterally, no tenderness to the left testicle Musculoskeletal:        General: No deformity.     Cervical back: Neck supple.     Comments: Tenderness across the low back, worse over the right low back musculature, does not radiate into the legs, negative straight leg raise bilaterally  Skin:    General: Skin is warm and dry.     Capillary Refill: Capillary refill takes less than 2 seconds.  Neurological:     Mental Status: He is alert and oriented to person, place, and time.     Coordination: Coordination  normal.     Comments: Speech is clear, able to follow commands CN III-XII intact Normal strength in upper and lower extremities bilaterally including dorsiflexion and plantar flexion, strong and equal grip strength Sensation normal to light and sharp touch Moves extremities without ataxia, coordination intact  Psychiatric:        Mood and Affect: Mood normal.        Behavior: Behavior normal.     ED Results / Procedures / Treatments   Labs (all labs ordered are listed, but only abnormal results are displayed) Labs Reviewed  CBC WITH DIFFERENTIAL/PLATELET - Abnormal; Notable for the following components:      Result Value   MCHC 36.2 (*)    All other components within normal limits  COMPREHENSIVE METABOLIC PANEL - Abnormal; Notable for the following components:   Glucose, Bld 164 (*)    All other components within normal limits  URINALYSIS, ROUTINE W REFLEX MICROSCOPIC - Abnormal; Notable for the following components:   Protein, ur TRACE (*)    All other components within normal limits    EKG None  Radiology US SCROTUM W/DOPPLER  Result Date: 08/16/2022 CLINICAL DATA:  One week of right-sided testicular pain. EXAM: SCROTAL ULTRASOUND DOPPLER ULTRASOUND OF THE TESTICLES TECHNIQUE: Complete ultrasound examination of the testicles, epididymis, and other scrotal structures was performed. Color and spectral Doppler ultrasound were  also utilized to evaluate blood flow to the testicles. COMPARISON:  Same day CT of the abdomen pelvis. FINDINGS: Right testicle Measurements: 5.2 x 3.5 x 2.3 cm. No mass or microlithiasis visualized. Left testicle Measurements: 4.6 x 3.2 x 2.1 cm. No mass or microlithiasis visualized. Right epididymis:  Normal in size.  1.2 cm epididymal cyst. Left epididymis:  Normal in size.  0.2 cm epididymal cyst. Hydrocele:  Small bilateral hydroceles. Varicocele:  None visualized. Pulsed Doppler interrogation of both testes demonstrates normal low resistance arterial and venous waveforms bilaterally. IMPRESSION: 1. Small bilateral hydroceles without evidence of testicular torsion or testicular mass. 2. Simple appearing bilateral epididymal cysts. Electronically Signed   By: Dahlia Bailiff M.D.   On: 08/16/2022 15:49   CT Renal Stone Study  Result Date: 08/16/2022 CLINICAL DATA:  Flank pain, kidney stone suspected. Lower back pain/testicular pain on the right EXAM: CT ABDOMEN AND PELVIS WITHOUT CONTRAST TECHNIQUE: Multidetector CT imaging of the abdomen and pelvis was performed following the standard protocol without IV contrast. RADIATION DOSE REDUCTION: This exam was performed according to the departmental dose-optimization program which includes automated exposure control, adjustment of the mA and/or kV according to patient size and/or use of iterative reconstruction technique. COMPARISON:  09/06/2015 FINDINGS: Lower chest: Normal except for mild scarring at the right lung base. Hepatobiliary: Liver parenchyma is normal.  No calcified gallstones. Pancreas: Normal Spleen: Normal Adrenals/Urinary Tract: Adrenal glands are normal. No stone in either kidney. The right kidney is incompletely rotated. Slightly prominent extra renal pelvis. No evidence of hydroureteronephrosis or passing stone. No stone in the bladder or urethra. Stomach/Bowel: Stomach and small intestine are normal. No colon abnormality seen.  Vascular/Lymphatic: The aorta and IVC are normal.  No adenopathy. Reproductive: Normal Other: No free fluid or air. Musculoskeletal: Negative IMPRESSION: No abnormality seen to explain the acute presentation. No evidence of renal stone disease or other urinary tract pathology. Other solid organs appear normal as well. No bowel abnormality is seen. Electronically Signed   By: Nelson Chimes M.D.   On: 08/16/2022 15:28     Procedures Procedures    Medications Ordered in  ED Medications - No data to display  ED Course/ Medical Decision Making/ A&P                           Medical Decision Making Amount and/or Complexity of Data Reviewed Labs: ordered. Radiology: ordered.  Risk Prescription drug management.   61 y.o. male presents to the ED with complaints of back, flank and testicle pain, this involves an extensive number of treatment options, and is a complaint that carries with it a high risk of complications and morbidity.  The differential diagnosis includes testicular torsion, epididymitis, orchitis, hernia, varicocele, cystocele, spermatocele, kidney stone, pyelonephritis, UTI  On arrival pt is nontoxic, vitals significant for mild hypertension, otherwise normal.   Additional history obtained from spouse at bedside. Previous records obtained and reviewed    Lab Tests:  I Ordered, reviewed, and interpreted labs, which included: No leukocytosis, normal hemoglobin, no significant electrolyte derangements, normal renal and liver function, UA with trace protein but no hematuria or signs of infection  Imaging Studies ordered:  I ordered imaging studies which included CT renal stone study, scrotal ultrasound, I independently visualized and interpreted imaging which showed no evidence of renal stone or other abnormality within the urinary tract, no hydronephrosis, no other significant abnormalities, scrotal ultrasound with bilateral small hydroceles, no evidence of torsion or mass, there  are also simple appearing bilateral epididymal cysts  ED Course:   Evaluation today is overall reassuring, highest suspicion is for musculoskeletal pain in the setting of increased physical activity and heavy lifting during recent fishing trip.  We will have patient treat symptoms symptomatically and follow-up with PCP.  Given information for urology follow-up if patient continues to have persistent testicular pain and discussed small hydroceles and epididymal cyst seen on ultrasound.  At this time there does not appear to be any evidence of an acute emergency medical condition requiring further emergent evaluation and the patient appears stable for discharge with appropriate outpatient follow up. Diagnosis and return precautions discussed with patient who verbalizes understanding and is agreeable to discharge.     Portions of this note were generated with [employer]. Dictation errors may occur despite best attempts at proofreading.         Final Clinical Impression(s) / ED Diagnoses Final diagnoses:  Right flank pain  Pain in right testicle    Rx / DC Orders ED Discharge Orders          Ordered    ibuprofen (ADVIL) 600 MG tablet  Every 6 hours PRN        08/16/22 1635    methocarbamol (ROBAXIN) 500 MG tablet  2 times daily        08/16/22 1635              Jacqlyn Larsen, [place] 09/07/22 1429    Lacretia Leigh, MD 09/24/22 1209

## 2022-08-16 NOTE — ED Notes (Signed)
Patient verbalizes understanding of discharge instructions. Opportunity for questioning and answers were provided. Patient discharged from ED.  °

## 2022-08-16 NOTE — Discharge Instructions (Addendum)
HOME INSTRUCTIONS Self - care:  The application of heat can help soothe the pain.  Maintaining your daily activities, including walking (this is encouraged), as it will help you get better faster than just staying in bed. Do not life, push, pull anything more than 10 pounds for the next week. I am attaching back exercises that you can do at home to help facilitate your recovery.   Back Exercises - I have attached a handout on back exercises that can be done at home to help facilitate your recovery.   Medications are also useful to help with pain control.   Acetaminophen.  This medication is generally safe, and found over the counter. Take as directed for your age. You should not take more than 8 of the extra strength ('500mg'$ ) pills a day (max dose is '4000mg'$  total OVER one day)  Non steroidal anti inflammatory: This includes medications including Ibuprofen, naproxen and Mobic; These medications help both pain and swelling and are very useful in treating back pain.  They should be taken with food, as they can cause stomach upset, and more seriously, stomach bleeding. Do not combine the medications.  Lidocaine Patch: Salon Pas lidocaine patches (blue and silver box) can be purchased over the counter and worn for 12 hours for local pain relief   Muscle relaxants:  These medications can help with muscle tightness that is a cause of lower back pain.  Most of these medications can cause drowsiness, and it is not safe to drive or use dangerous machinery while taking them. They are primarily helpful when taken at night before sleep.  You will need to follow up with your primary healthcare provider if symptoms or not improving by the end of the week.  Be aware that if you develop new symptoms, such as a fever, leg weakness, difficulty with or loss of control of your urine or bowels, abdominal pain, or more severe pain, you will need to seek medical attention and/or return to the Emergency  department. Additional Information:  Your vital signs today were: BP 139/83 (BP Location: Right Arm)   Pulse 61   Temp 98.5 F (36.9 C) (Oral)   Resp 18   Ht '5\' 11"'$  (1.803 m)   Wt 90.7 kg   SpO2 94%   BMI 27.89 kg/m  If your blood pressure (BP) was elevated above 135/85 this visit, please have this repeated by your doctor within one month. ---------------

## 2022-08-19 ENCOUNTER — Institutional Professional Consult (permissible substitution): Payer: BC Managed Care – PPO | Admitting: Neurology

## 2022-10-20 ENCOUNTER — Ambulatory Visit: Payer: BC Managed Care – PPO | Admitting: Orthopaedic Surgery

## 2022-11-04 ENCOUNTER — Encounter (INDEPENDENT_AMBULATORY_CARE_PROVIDER_SITE_OTHER): Payer: BC Managed Care – PPO | Admitting: Ophthalmology

## 2022-12-30 IMAGING — CR DG FEMUR 2+V*L*
4 series · 4 of 4 positions shown · non-contrast
Comparison: None.

CLINICAL DATA: Water skiing accident, left leg pain

EXAM:
LEFT FEMUR 2 VIEWS

[femur lat (1 of 2)]
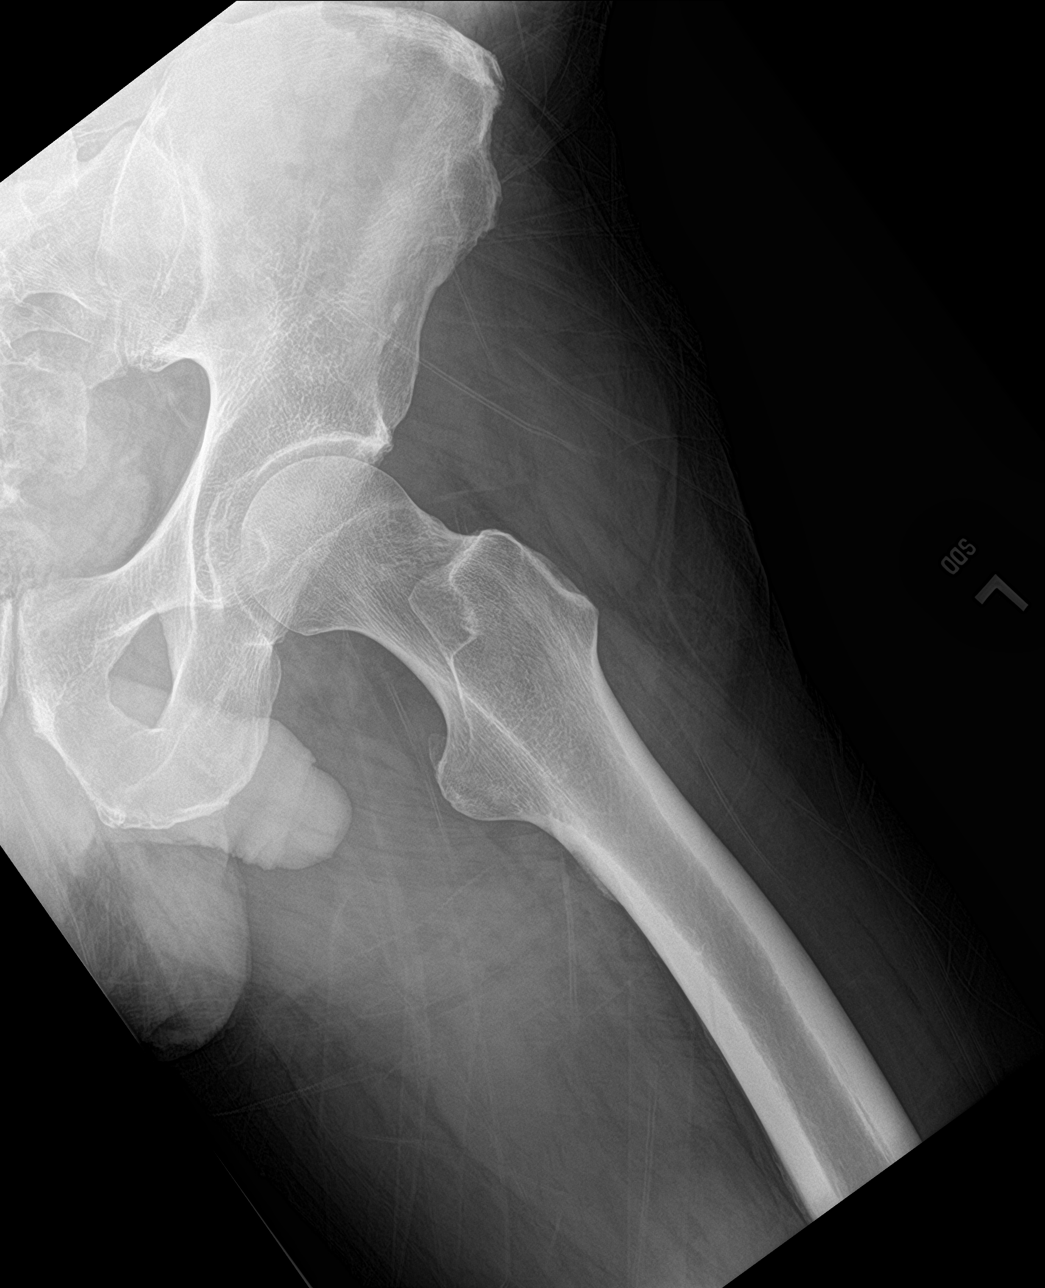

[femur lat (2 of 2)]
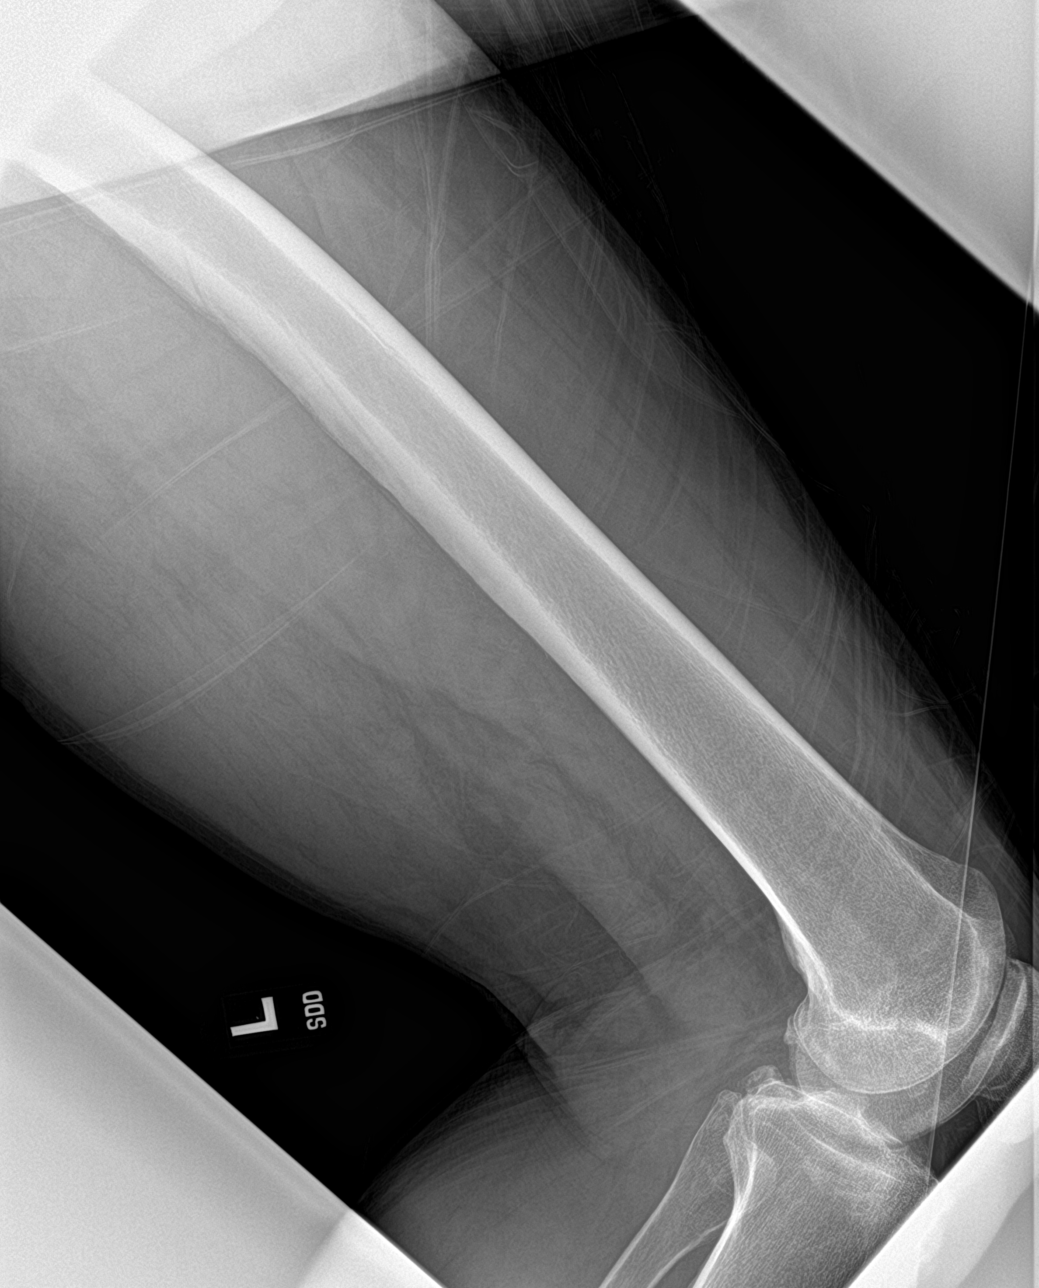

[femur ap (1 of 2)]
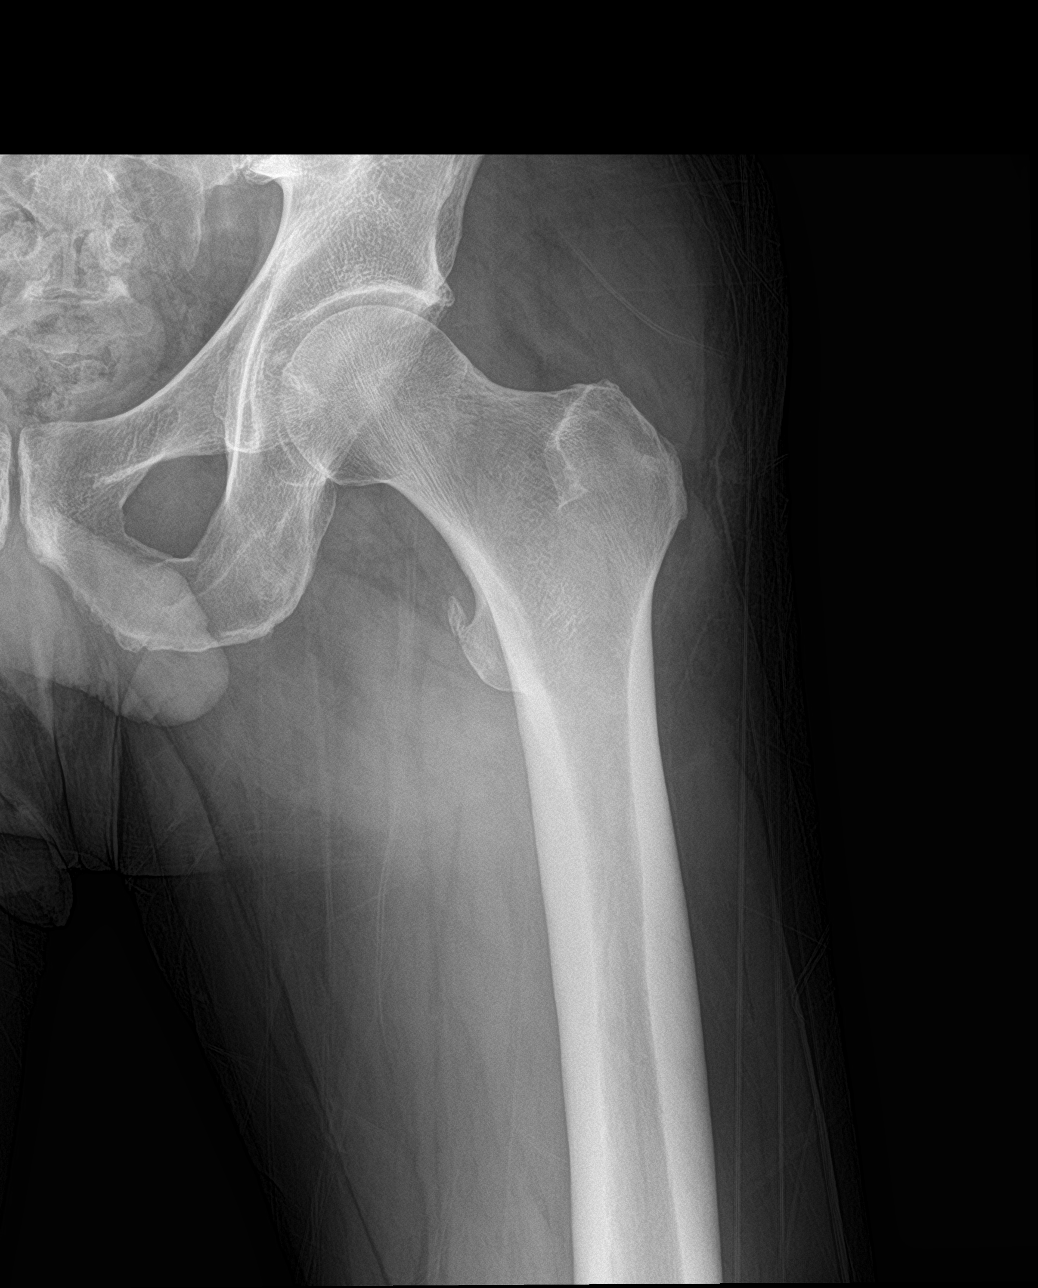

[femur ap (2 of 2)]
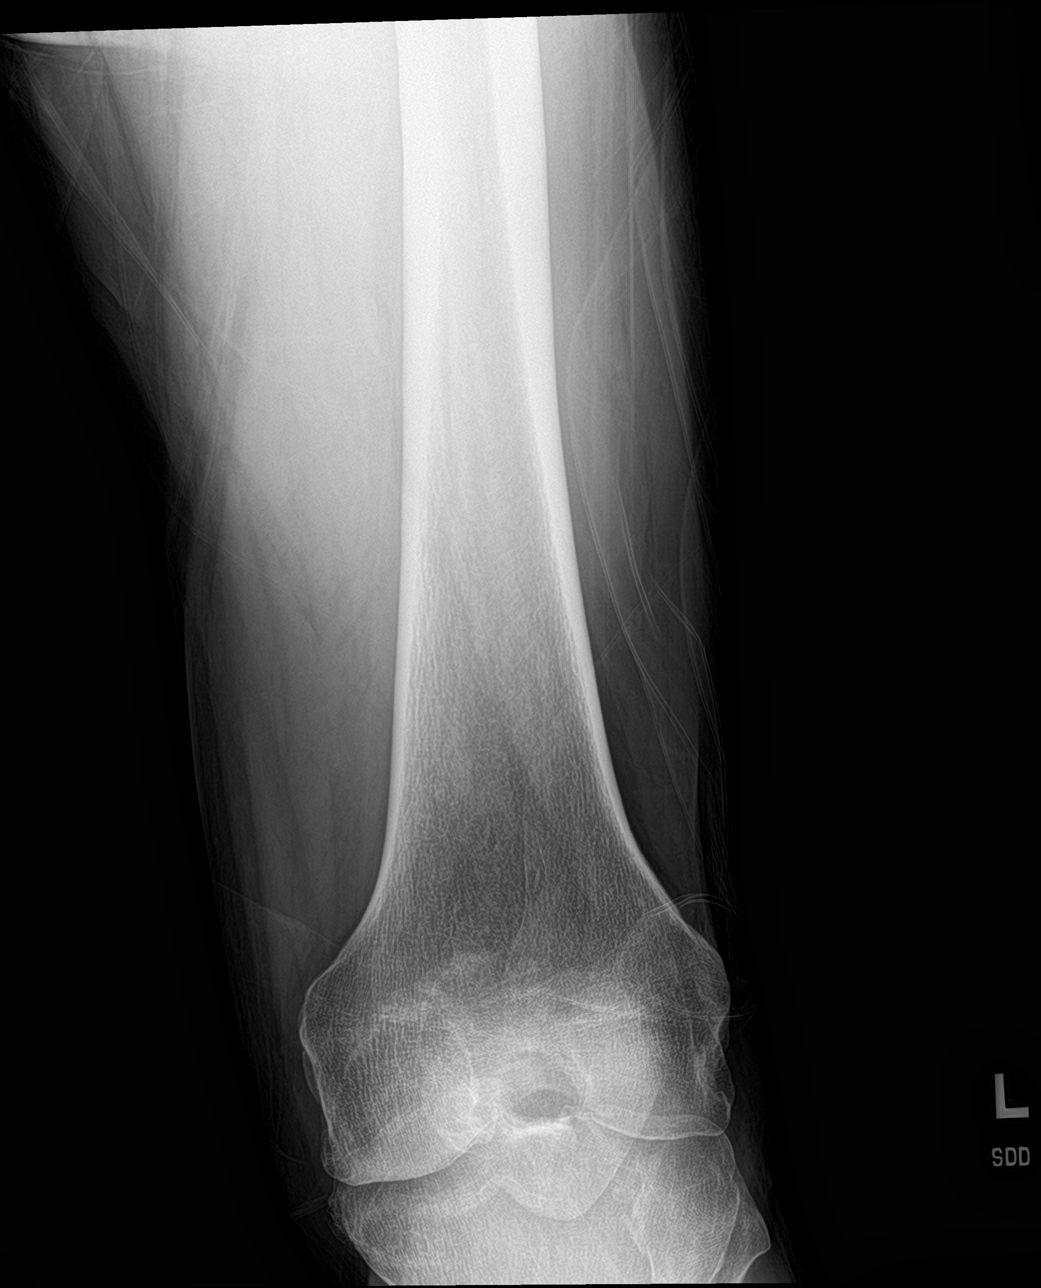

[4 of 4 positions shown; findings below may reference images not displayed]

FINDINGS: There is no evidence of fracture or other focal bone lesions. Soft
tissues are unremarkable.
IMPRESSION: Negative.

## 2023-01-24 ENCOUNTER — Encounter: Payer: Self-pay | Admitting: Physician Assistant

## 2023-01-24 ENCOUNTER — Ambulatory Visit: Payer: BC Managed Care – PPO | Admitting: Physician Assistant

## 2023-01-24 DIAGNOSIS — M1712 Unilateral primary osteoarthritis, left knee: Secondary | ICD-10-CM | POA: Diagnosis not present

## 2023-01-24 MED ORDER — LIDOCAINE HCL 1 % IJ SOLN
3.0000 mL | INTRAMUSCULAR | Status: AC | PRN
  Administered 2023-01-24: 3 mL

## 2023-01-24 MED ORDER — METHYLPREDNISOLONE ACETATE 40 MG/ML IJ SUSP
40.0000 mg | INTRAMUSCULAR | Status: AC | PRN
  Administered 2023-01-24: 40 mg via INTRA_ARTICULAR

## 2023-01-24 NOTE — Progress Notes (Signed)
   Procedure Note  Patient: Louis Joseph             Date of Birth: [birth year]           MRN: [id number]             Visit Date: 01/24/2023 HPI: Mr. Louis Joseph comes in today requesting a left knee cortisone injection.  Last injection 05/31/2022.  States that the injection helped for at least 4 months.  He has had no new injury.  He is diabetic reports his hemoglobin A1c is 7.2.  Denies any fevers or chills.  No new injury to the knee.  Known osteoarthritis left knee.  Review of systems: See HPI otherwise negative  Physical exam: General: Pleasant male in no acute distress mood and affect appropriate Left knee: Good range of motion left knee no abnormal warmth erythema or effusion.  No instability  Procedures: Visit Diagnoses:  1. Primary osteoarthritis of left knee     Large Joint Inj: L knee on 01/24/2023 9:44 AM Indications: pain Details: 22 G 1.5 in needle, anterolateral approach  Arthrogram: No  Medications: 3 mL lidocaine 1 %; 40 mg methylPREDNISolone acetate 40 MG/ML Outcome: tolerated well, no immediate complications Procedure, treatment alternatives, risks and benefits explained, specific risks discussed. Consent was given by the patient. Immediately prior to procedure a time out was called to verify the correct patient, procedure, equipment, support staff and site/side marked as required. Patient was prepped and draped in the usual sterile fashion.    Plan: He will follow-up with [place] as needed knows to wait at least 3 months between injections.  Questions were encouraged and answered.

## 2023-02-24 ENCOUNTER — Encounter: Payer: Self-pay | Admitting: Radiology

## 2023-05-15 ENCOUNTER — Ambulatory Visit (INDEPENDENT_AMBULATORY_CARE_PROVIDER_SITE_OTHER): Payer: BC Managed Care – PPO

## 2023-05-15 ENCOUNTER — Ambulatory Visit
Admission: EM | Admit: 2023-05-15 | Discharge: 2023-05-15 | Disposition: A | Discharge status: Home / Self Care | Payer: BC Managed Care – PPO

## 2023-05-15 DIAGNOSIS — J189 Pneumonia, unspecified organism: Secondary | ICD-10-CM

## 2023-05-15 DIAGNOSIS — J0100 Acute maxillary sinusitis, unspecified: Secondary | ICD-10-CM | POA: Diagnosis not present

## 2023-05-15 DIAGNOSIS — R9389 Abnormal findings on diagnostic imaging of other specified body structures: Secondary | ICD-10-CM | POA: Diagnosis not present

## 2023-05-15 MED ORDER — AMOXICILLIN-POT CLAVULANATE 875-125 MG PO TABS: 1.0000 | ORAL_TABLET | Freq: Two times a day (BID) | ORAL | 0 refills | Status: AC

## 2023-05-15 MED ORDER — AZITHROMYCIN 250 MG PO TABS: 250.0000 mg | ORAL_TABLET | Freq: Every day | ORAL | 0 refills | Status: DC

## 2023-05-15 NOTE — Discharge Instructions (Addendum)
Start taking the Augmentin twice daily with food.  Take it for all 10 days to help with your sinus congestion and pressure. Start taking azithromycin per the package directions.  This will help cover for suspected mycoplasma pneumonia.  Please use plain Mucinex twice daily to help break up the phlegm and get it out of your chest.  Please do not use a cough suppressant as this may prevent improvement to your chest symptoms.  Please call your primary care physician and schedule follow-up within 2 weeks.  A repeat chest x-ray or possibly a CT may be necessary.  If you develop any chest pain, shortness of breath, fever, or any worsening symptoms, please head to the emergency room immediately

## 2023-05-15 NOTE — ED Provider Notes (Signed)
[facility]    CSN: [id number] Arrival date & time: 05/15/23  4098      History   Chief Complaint Chief Complaint  Patient presents with  . Sinus Problem    HPI Louis Joseph is a 62 y.o. male.   Pleasant 62 year old male presents today due to concerns of sinus issues and cough.  He states he has been having symptoms for the past 3 to 3-1/2 weeks.  He reports pressure in his sinuses and postnasal drainage, but his main concern is his continued cough.  He states it is much worse in the morning and certainly at nighttime.  Even if he is not laying supine he has a significantly harsh and problematic cough at night.  He reports it is productive.  He denies any blood.  He does not smoke.  He reports pain to his left shoulder extending down to his scapula when he coughs.  He had a CT calcium scoring in 2022 which showed a 5 mm left pulmonary nodule.  He has been taking several over-the-counter medications without symptom improvement.  He denies a known fever. O2 in office 93-94%.   Sinus Problem   Past Medical History:  Diagnosis Date  . Colon adenomas   . Diabetes mellitus without complication (HCC)   . DM w/o complication type II (HCC) 03/19/2015  . Dyslipidemia 03/19/2015  . GERD (gastroesophageal reflux disease) 03/19/2015  . Gilbert's syndrome   . Hearing loss    s/p hearing aides  . Osteopenia   . PUD (peptic ulcer disease)     Patient Active Problem List   Diagnosis Date Noted  . 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 Nuclear sclerotic cataract of both eyes 11/03/2020  . 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 Trigger finger of right thumb 06/14/2019  . 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 Foreign body (FB) in soft tissue 11/21/2018  . Metatarsalgia 07/26/2018  . Arthritis of left acromioclavicular joint 04/10/2018  . Acute colitis 09/08/2015  . Fever 09/06/2015  . Nausea vomiting and diarrhea 09/06/2015  .  Headache 09/06/2015  . Sepsis (HCC) 09/06/2015  . AKI (acute kidney injury) (HCC) 09/06/2015  . Bad headache   . Dyslipidemia 03/19/2015  . GERD (gastroesophageal reflux disease) 03/19/2015  . SOB (shortness of breath) 03/19/2015  . DM w/o complication type II (HCC) 03/19/2015  . Gilbert's syndrome   . Osteopenia   . PUD (peptic ulcer disease)   . Colon adenomas   . Hearing loss   . Knee pain 08/14/2013    Past Surgical History:  Procedure Laterality Date  . COLONOSCOPY N/A 12/02/2014   Procedure: COLONOSCOPY;  Surgeon: Charolett Bumpers, MD;  Location: [facility];  Service: Endoscopy;  Laterality: N/A;  . HAND SURGERY    . KNEE SURGERY         Home Medications    Prior to Admission medications   Medication Sig Start Date End Date Taking? Authorizing Provider  amoxicillin-clavulanate (AUGMENTIN) 875-125 MG tablet Take 1 tablet by mouth 2 (two) times daily with a meal for 10 days. 05/15/23 05/25/23 Yes Luis Sami L, PA  aspirin 81 MG tablet Take 81 mg by mouth daily. Pt takes 1 tablet 4 times a week   Yes [provider]  azithromycin (ZITHROMAX) 250 MG tablet Take 1 tablet (250 mg total) by mouth daily. Take first 2 tablets together, then 1 every day until finished. 05/15/23  Yes Shiheem Corporan L, PA  benazepril (  LOTENSIN) 5 MG tablet Take 1 tablet by mouth daily. 09/22/18  Yes [provider]  Calcium Carb-Cholecalciferol (CALCIUM 600+D3 PO) Take 1 capsule by mouth 2 (two) times daily.   Yes [provider]  Continuous Glucose Transmitter (DEXCOM G6 TRANSMITTER) MISC SMARTSIG:Topical Every 3 Months 04/12/23  Yes [provider]  Glucagon, rDNA, (GLUCAGON EMERGENCY) 1 MG KIT Glucagon Emergency Kit 1 mg solution for injection 10/23/20  Yes [provider]  glucose blood test strip OneTouch Verio test strips   Yes [provider]  ibuprofen (ADVIL) 600 MG tablet Take 1 tablet (600 mg total) by mouth every 6 (six) hours as  needed. 08/16/22  Yes Dartha Lodge, PA-C  Insulin Disposable Pump (OMNIPOD 5 G6 INTRO, GEN 5,) KIT SMARTSIG:SUB-Q Every 3 Days 04/12/23  Yes [provider]  Insulin Disposable Pump (OMNIPOD 5 G6 PODS, GEN 5,) MISC SMARTSIG:SUB-Q Every 3 Days 04/12/23  Yes [provider]  metFORMIN (GLUCOPHAGE) 1000 MG tablet Take 1,000 mg by mouth 2 (two) times daily. 03/17/15  Yes [provider]  NOVOLOG 100 UNIT/ML injection USE IN INSULIN PUMP 50 UNITS PER DAY 09/22/18  Yes [provider]  pantoprazole (PROTONIX) 40 MG tablet Take 40 mg by mouth 2 (two) times daily. 03/12/15  Yes [provider]  bacitracin-polymyxin b, ophth, (POLYSPORIN) OINT     [provider]  betamethasone acetate-betamethasone sodium phosphate (CELESTONE) 6 (3-3) MG/ML injection Inject into the articular space. 10/23/20   [provider]  Cholecalciferol 25 MCG (1000 UT) capsule Take by mouth. 08/14/13   [provider]  Continuous Glucose Sensor (FREESTYLE LIBRE 14 DAY SENSOR) MISC  10/02/20   [provider]  Continuous Glucose Sensor (FREESTYLE LIBRE 14 DAY SENSOR) MISC  10/02/20   [provider]  Continuous Glucose Sensor (FREESTYLE LIBRE 3 SENSOR) MISC Apply topically every 14 (fourteen) days. 01/17/23   [provider]  HYDROcodone-acetaminophen (NORCO/VICODIN) 5-325 MG per tablet Take 1-2 tablets by mouth every 4 (four) hours as needed for moderate pain. 09/08/15   Jerald Kief, MD  Insulin Human (INSULIN PUMP) 100 unit/ml SOLN Inject into the skin.    [provider]  Insulin Human (INSULIN PUMP) SOLN by miscellaneous route. 08/14/13   [provider]  Insulin Infusion Pump (MINIMED INSULIN PUMP) DEVI by Misc.(Non-Drug; Combo Route) route.    [provider]  lidocaine (XYLOCAINE) 1 % (with preservative) injection by Infiltration route. 10/23/20   [provider]  methocarbamol (ROBAXIN) 500 MG tablet  Take 1 tablet (500 mg total) by mouth 2 (two) times daily. 08/16/22   Dartha Lodge, PA-C  Mupirocin 2 % KIT     [provider]  nabumetone (RELAFEN) 750 MG tablet Take 1 tablet (750 mg total) by mouth 2 (two) times daily as needed. 06/15/21   Kathryne Hitch, MD  pravastatin (PRAVACHOL) 40 MG tablet Take 20 mg by mouth daily. Pt takes 1/2 tablet by mouth in the evening. 02/13/15   [provider]  saccharomyces boulardii (FLORASTOR) 250 MG capsule Take 1 capsule (250 mg total) by mouth 2 (two) times daily. 09/08/15   Jerald Kief, MD  tiZANidine (ZANAFLEX) 4 MG tablet Take 1 tablet (4 mg total) by mouth every 8 (eight) hours as needed for muscle spasms. 06/15/21   Kathryne Hitch, MD  traMADol (ULTRAM) 50 MG tablet Take 1 tablet (50 mg total) by mouth every 6 (six) hours as needed for moderate pain. 07/14/16   Kindl,  Quita Skye, MD    Family History Family History  Problem Relation Age of Onset  . Diabetes Other   . Uterine cancer Mother   . Heart disease Father   . Diabetes Father   . Hypertension Father   . Hypertension Sister     Social History Social History   Tobacco Use  . Smoking status: Never  . Smokeless tobacco: Never  Vaping Use  . Vaping Use: Never used  Substance Use Topics  . Alcohol use: No  . Drug use: No     Allergies   Codeine   Review of Systems Review of Systems As per HPI  Physical Exam Triage Vital Signs ED Triage Vitals  Enc Vitals Group     BP 05/15/23 1057 (!) 157/84     Pulse Rate 05/15/23 1057 74     Resp 05/15/23 1057 (!) 22     Temp 05/15/23 1057 98.5 F (36.9 C)     Temp Source 05/15/23 1057 Oral     SpO2 05/15/23 1057 96 %     Weight 05/15/23 1051 200 lb (90.7 kg)     Height 05/15/23 1051 5\' 11"  (1.803 m)     Head Circumference --      Peak Flow --      Pain Score 05/15/23 1051 0     Pain Loc --      Pain Edu? --      Excl. in GC? --    No data found.  Updated Vital Signs BP (!) 145/82 (BP  Location: Left Arm)   Pulse 72   Temp 98.5 F (36.9 C) (Oral)   Resp 20   Ht 5\' 11"  (1.803 m)   Wt 200 lb (90.7 kg)   SpO2 95%   BMI 27.89 kg/m   Visual Acuity Right Eye Distance:   Left Eye Distance:   Bilateral Distance:    Right Eye Near:   Left Eye Near:    Bilateral Near:     Physical Exam Vitals and nursing note reviewed.  Constitutional:      General: He is not in acute distress.    Appearance: Normal appearance. He is normal weight. He is not ill-appearing, toxic-appearing or diaphoretic.  HENT:     Head: Normocephalic and atraumatic.     Right Ear: Tympanic membrane, ear canal and external ear normal. No drainage, swelling or tenderness. No middle ear effusion. There is no impacted cerumen. Tympanic membrane is not erythematous.     Left Ear: Tympanic membrane, ear canal and external ear normal. No drainage, swelling or tenderness.  No middle ear effusion. There is no impacted cerumen. Tympanic membrane is not erythematous.     Nose: Rhinorrhea present. No congestion.     Right Turbinates: Not enlarged or swollen.     Left Turbinates: Not enlarged or swollen.     Right Sinus: Maxillary sinus tenderness present. No frontal sinus tenderness.     Left Sinus: Maxillary sinus tenderness present. No frontal sinus tenderness.     Mouth/Throat:     Mouth: Mucous membranes are moist. No oral lesions.     Pharynx: Oropharynx is clear. No pharyngeal swelling, oropharyngeal exudate, posterior oropharyngeal erythema or uvula swelling.     Tonsils: No tonsillar exudate or tonsillar abscesses.  Eyes:     Extraocular Movements:     Left eye: Normal extraocular motion.     Conjunctiva/sclera: Conjunctivae normal.     Pupils: Pupils are equal, round, and reactive to light.  Neck:     Thyroid: No thyromegaly.  Cardiovascular:     Rate and Rhythm: Normal rate.     Heart sounds: Normal heart sounds. No murmur heard.    No friction rub. No gallop.  Pulmonary:     Effort:  Pulmonary effort is normal. No respiratory distress.     Breath sounds: Normal breath sounds. No stridor. No wheezing, rhonchi (with harsh crackels to L middle lobe) or rales.  Chest:     Chest wall: No tenderness.  Abdominal:     Palpations: Abdomen is soft.  Musculoskeletal:     Cervical back: Normal range of motion and neck supple.  Lymphadenopathy:     Cervical: No cervical adenopathy.  Skin:    General: Skin is warm.     Capillary Refill: Capillary refill takes less than 2 seconds.     Coloration: Skin is not pale.     Findings: No erythema or rash.  Neurological:     General: No focal deficit present.     Mental Status: He is alert.  Psychiatric:        Mood and Affect: Mood normal.        Behavior: Behavior normal.     UC Treatments / Results  Labs (all labs ordered are listed, but only abnormal results are displayed) Labs Reviewed - No data to display  EKG   Radiology DG Chest 2 View  Result Date: 05/15/2023 CLINICAL DATA:  Cough. EXAM: CHEST - 2 VIEW COMPARISON:  09/06/2015 chest x-ray and coronary calcium score study on 06/01/2021 FINDINGS: The heart size is normal. Compared to the prior chest x-ray, there is some suggestion of mildly increased soft tissue prominence in both hilar regions which may simply be due to central pulmonary vasculature. Subtle lymphadenopathy cannot be excluded, especially on the left. No focal airspace consolidation, pulmonary edema, pneumothorax, pleural fluid or discrete pulmonary lesion is identified by x-ray. Visualized bony structures are unremarkable. IMPRESSION: Suggestion of mildly increased soft tissue prominence in both hilar regions which may simply be due to central pulmonary vasculature. Subtle lymphadenopathy cannot be excluded, especially on the left. Consider further evaluation with chest CT with contrast. Electronically Signed   By: Irish Lack M.D.   On: 05/15/2023 11:32    Procedures Procedures (including critical care  time)  Medications Ordered in UC Medications - No data to display  Initial Impression / Assessment and Plan / UC Course  I have reviewed the triage vital signs and the nursing notes.  Pertinent labs & imaging results that were available during my care of the patient were reviewed by me and considered in my medical decision making (see chart for details).     CAP -symptoms most concerning and consistent with community-acquired pneumonia.  Discussed chest x-ray findings with patient.  He has had the symptoms for 3 weeks now, his vital signs are stable.  I was able to get his oxygen up to 95% with deep breathing.  His temp is normal pulse rate normal respirations normal.  He does not appear acutely ill or toxic.  Will therefore start with the dual therapy of Augmentin and azithromycin.  I did discuss with him that a chest CT may be indicated, but will have him follow-up with his PCP in 2 to 3 weeks to determine next best steps.  Patient also understands that should his symptoms persist, or if he develops worsening symptoms, he should head to the ER immediately for further workup. Sinusitis -we will give him  Augmentin for the full 10 days, rather than the 5 recommended for CAP. Standard chest x-ray abnormal -reviewed results with patient.  He will follow-up with his primary care to determine if repeat chest x-ray after antibiotic treatment indicated, or whether CT should be pursued.  Patient will head to the emergency room should any significant change in his condition.   Final Clinical Impressions(s) / UC Diagnoses   Final diagnoses:  Community acquired pneumonia of left lung, unspecified part of lung  Acute non-recurrent maxillary sinusitis  Standard chest x-ray abnormal     Discharge Instructions      Start taking the Augmentin twice daily with food.  Take it for all 10 days to help with your sinus congestion and pressure. Start taking azithromycin per the package directions.  This will  help cover for suspected mycoplasma pneumonia.  Please use plain Mucinex twice daily to help break up the phlegm and get it out of your chest.  Please do not use a cough suppressant as this may prevent improvement to your chest symptoms.  Please call your primary care physician and schedule follow-up within 2 weeks.  A repeat chest x-ray or possibly a CT may be necessary.  If you develop any chest pain, shortness of breath, fever, or any worsening symptoms, please head to the emergency room immediately     ED Prescriptions     Medication Sig Dispense Auth. Provider   amoxicillin-clavulanate (AUGMENTIN) 875-125 MG tablet Take 1 tablet by mouth 2 (two) times daily with a meal for 10 days. 20 tablet Malyna Budney L, PA   azithromycin (ZITHROMAX) 250 MG tablet Take 1 tablet (250 mg total) by mouth daily. Take first 2 tablets together, then 1 every day until finished. 6 tablet Antonya Leeder L, [place]      PDMP not reviewed this encounter.   Maretta Bees, [place] 05/15/23 1715

## 2023-05-15 NOTE — ED Triage Notes (Signed)
"  Sinus congestion, pain/pressure" starting about 3 wks ago and not improvement". Cough "now" that started "last Sunday". No fever.

## 2023-06-01 ENCOUNTER — Ambulatory Visit
Admission: RE | Admit: 2023-06-01 | Discharge: 2023-06-01 | Disposition: A | Discharge status: Home / Self Care | Payer: BC Managed Care – PPO | Source: Ambulatory Visit | Attending: Family Medicine | Admitting: Family Medicine

## 2023-06-01 ENCOUNTER — Other Ambulatory Visit: Payer: Self-pay | Admitting: Family Medicine

## 2023-06-01 DIAGNOSIS — R918 Other nonspecific abnormal finding of lung field: Secondary | ICD-10-CM

## 2023-06-01 MED ORDER — IOPAMIDOL (ISOVUE-300) INJECTION 61%
75.0000 mL | Freq: Once | INTRAVENOUS | Status: AC | PRN
  Administered 2023-06-01: 75 mL via INTRAVENOUS

## 2023-07-20 ENCOUNTER — Ambulatory Visit
Admission: EM | Admit: 2023-07-20 | Discharge: 2023-07-20 | Disposition: A | Discharge status: Home / Self Care | Payer: BC Managed Care – PPO | Attending: Internal Medicine | Admitting: Internal Medicine

## 2023-07-20 DIAGNOSIS — L03113 Cellulitis of right upper limb: Secondary | ICD-10-CM

## 2023-07-20 MED ORDER — SULFAMETHOXAZOLE-TRIMETHOPRIM 800-160 MG PO TABS: 1.0000 | ORAL_TABLET | Freq: Two times a day (BID) | ORAL | 0 refills | Status: AC

## 2023-07-20 NOTE — ED Provider Notes (Signed)
[facility]    CSN: [id number] Arrival date & time: 07/20/23  0945      History   Chief Complaint Chief Complaint  Patient presents with   Arm Pain    HPI Louis Joseph is a 62 y.o. male.   Patient presents with pain and redness to right upper arm that started few days prior.  States that it started after he applied his Dexcom glucose monitor.  He has since removed the monitor and placed it elsewhere but pain and redness has been persistent.  He denies any fever or injury to the area.   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   Trigger finger of right thumb 06/14/2019   Hand arthritis 11/21/2018   Foreign body (FB) in soft tissue 11/21/2018   Metatarsalgia 07/26/2018   Arthritis of left acromioclavicular joint 04/10/2018   Acute colitis 09/08/2015   Fever 09/06/2015   Nausea vomiting and diarrhea 09/06/2015   Headache 09/06/2015   Sepsis (HCC) 09/06/2015   AKI (acute kidney injury) (HCC) 09/06/2015   Bad headache    Dyslipidemia 03/19/2015   GERD (gastroesophageal reflux disease) 03/19/2015   SOB (shortness of breath) 03/19/2015   DM w/o complication type II (HCC) 03/19/2015   Gilbert's syndrome    Osteopenia    PUD (peptic ulcer disease)    Colon adenomas    Hearing loss    Knee pain 08/14/2013    Past Surgical History:  Procedure Laterality Date   COLONOSCOPY N/A 12/02/2014   Procedure: COLONOSCOPY;   Surgeon: Charolett Bumpers, MD;  Location: [facility];  Service: Endoscopy;  Laterality: N/A;   HAND SURGERY     KNEE SURGERY         Home Medications    Prior to Admission medications   Medication Sig Start Date End Date Taking? Authorizing Provider  sulfamethoxazole-trimethoprim (BACTRIM DS) 800-160 MG tablet Take 1 tablet by mouth 2 (two) times daily for 7 days. 07/20/23 07/27/23 Yes Gustavus Bryant, FNP  aspirin 81 MG tablet Take 81 mg by mouth daily. Pt takes 1 tablet 4 times a week    [provider]  azithromycin (ZITHROMAX) 250 MG tablet Take 1 tablet (250 mg total) by mouth daily. Take first 2 tablets together, then 1 every day until finished. 05/15/23   Crain, Jodelle Gross, PA  bacitracin-polymyxin b, ophth, (POLYSPORIN) OINT     [provider]  benazepril (LOTENSIN) 5 MG tablet Take 1 tablet by mouth daily. 09/22/18   [provider]  betamethasone acetate-betamethasone sodium phosphate (CELESTONE) 6 (3-3) MG/ML injection Inject into the articular space. 10/23/20   [provider]  Calcium Carb-Cholecalciferol (CALCIUM 600+D3 PO) Take 1 capsule by mouth 2 (two) times daily.    [provider]  Cholecalciferol 25 MCG (1000 UT) capsule Take by mouth. 08/14/13   [provider]  Continuous Glucose Sensor (FREESTYLE LIBRE 14  DAY SENSOR) MISC  10/02/20   [provider]  Continuous Glucose Sensor (FREESTYLE LIBRE 14 DAY SENSOR) MISC  10/02/20   [provider]  Continuous Glucose Sensor (FREESTYLE LIBRE 3 SENSOR) MISC Apply topically every 14 (fourteen) days. 01/17/23   [provider]  Continuous Glucose Transmitter (DEXCOM G6 TRANSMITTER) MISC SMARTSIG:Topical Every 3 Months 04/12/23   [provider]  Glucagon, rDNA, (GLUCAGON EMERGENCY) 1 MG KIT Glucagon Emergency Kit 1 mg solution for injection 10/23/20   [provider]  glucose blood test strip OneTouch Verio test strips    [provider]  HYDROcodone-acetaminophen (NORCO/VICODIN) 5-325 MG per tablet Take 1-2 tablets by mouth every 4 (four) hours as needed for moderate pain. 09/08/15   Jerald Kief, MD  ibuprofen (ADVIL) 600 MG tablet Take 1 tablet (600 mg total) by mouth every 6 (six) hours as needed. 08/16/22   Dartha Lodge, PA-C  Insulin Disposable Pump (OMNIPOD 5 G6 INTRO, GEN 5,) KIT SMARTSIG:SUB-Q Every 3 Days 04/12/23   [provider]  Insulin Disposable Pump (OMNIPOD 5 G6 PODS, GEN 5,) MISC SMARTSIG:SUB-Q Every 3 Days 04/12/23   [provider]  Insulin Human (INSULIN PUMP) 100 unit/ml SOLN Inject into the skin.    [provider]  Insulin Human (INSULIN PUMP) SOLN by miscellaneous route. 08/14/13   [provider]  Insulin Infusion Pump (MINIMED INSULIN PUMP) DEVI by Misc.(Non-Drug; Combo Route) route.    [provider]  lidocaine (XYLOCAINE) 1 % (with preservative) injection by Infiltration route. 10/23/20   [provider]  metFORMIN (GLUCOPHAGE) 1000 MG tablet Take 1,000 mg by mouth 2 (two) times daily. 03/17/15   [provider]  methocarbamol (ROBAXIN) 500 MG tablet Take 1 tablet (500 mg total) by mouth 2 (two) times daily. 08/16/22   Dartha Lodge, PA-C  Mupirocin 2 % KIT     [provider]  nabumetone (RELAFEN) 750 MG tablet Take 1 tablet (750 mg total) by mouth 2 (two) times daily as needed. 06/15/21   Kathryne Hitch, MD  NOVOLOG 100 UNIT/ML injection USE IN INSULIN PUMP 50 UNITS PER DAY 09/22/18   [provider]  pantoprazole (PROTONIX) 40 MG tablet Take 40 mg by mouth 2 (two) times daily. 03/12/15   [provider]  pravastatin (PRAVACHOL) 40 MG tablet Take 20 mg by mouth daily. Pt takes 1/2 tablet by mouth in the evening. 02/13/15   [provider]  saccharomyces boulardii (FLORASTOR) 250 MG capsule Take 1 capsule (250 mg total) by mouth 2 (two) times daily. 09/08/15   Jerald Kief, MD   tiZANidine (ZANAFLEX) 4 MG tablet Take 1 tablet (4 mg total) by mouth every 8 (eight) hours as needed for muscle spasms. 06/15/21   Kathryne Hitch, MD  traMADol (ULTRAM) 50 MG tablet Take 1 tablet (50 mg total) by mouth every 6 (six) hours as needed for moderate pain. 07/14/16   Linna Hoff, MD    Family History Family History  Problem Relation Age of Onset   Diabetes Other    Uterine cancer Mother    Heart disease Father    Diabetes Father    Hypertension Father    Hypertension Sister     Social History Social History   Tobacco Use   Smoking status: Never   Smokeless tobacco: Never  Vaping Use   Vaping status: Never Used  Substance Use Topics   Alcohol use: No   Drug use: No  Allergies   Codeine   Review of Systems Review of Systems Per HPI  Physical Exam Triage Vital Signs ED Triage Vitals  Encounter Vitals Group     BP 07/20/23 1004 (!) 156/86     Systolic BP Percentile --      Diastolic BP Percentile --      Pulse Rate 07/20/23 1004 77     Resp 07/20/23 1004 18     Temp 07/20/23 1004 98.3 F (36.8 C)     Temp Source 07/20/23 1004 Oral     SpO2 07/20/23 1004 96 %     Weight --      Height --      Head Circumference --      Peak Flow --      Pain Score 07/20/23 1001 10     Pain Loc --      Pain Education --      Exclude from Growth Chart --    No data found.  Updated Vital Signs BP (!) 156/86 (BP Location: Right Arm)   Pulse 77   Temp 98.3 F (36.8 C) (Oral)   Resp 18   SpO2 96%   Visual Acuity Right Eye Distance:   Left Eye Distance:   Bilateral Distance:    Right Eye Near:   Left Eye Near:    Bilateral Near:     Physical Exam Constitutional:      General: He is not in acute distress.    Appearance: Normal appearance. He is not toxic-appearing or diaphoretic.  HENT:     Head: Normocephalic and atraumatic.  Eyes:     Extraocular Movements: Extraocular movements intact.     Conjunctiva/sclera: Conjunctivae normal.   Pulmonary:     Effort: Pulmonary effort is normal.  Skin:    Comments: Patient has erythema and warmth present to posterior portion of right upper arm but does not extend to the elbow.  Patient has full range of motion of upper extremity.  Grip strength is 5/5.  Neurovascularly intact.  No lesions or obvious abscesses noted.  Neurological:     General: No focal deficit present.     Mental Status: He is alert and oriented to person, place, and time. Mental status is at baseline.  Psychiatric:        Mood and Affect: Mood normal.        Behavior: Behavior normal.        Thought Content: Thought content normal.        Judgment: Judgment normal.      UC Treatments / Results  Labs (all labs ordered are listed, but only abnormal results are displayed) Labs Reviewed - No data to display  EKG   Radiology No results found.  Procedures Procedures (including critical care time)  Medications Ordered in UC Medications - No data to display  Initial Impression / Assessment and Plan / UC Course  I have reviewed the triage vital signs and the nursing notes.  Pertinent labs & imaging results that were available during my care of the patient were reviewed by me and considered in my medical decision making (see chart for details).     Physical exam is consistent with cellulitis of the upper arm.  Will treat with Bactrim.  Patient encouraged to monitor glucose more closely while on antibiotic given he takes metformin.  Advised monitoring symptoms closely and following up if symptoms persist or worsen.  Advised to keep Dexcom monitor out of this area until healed over.  Patient verbalized understanding and was agreeable with plan. Final Clinical Impressions(s) / UC Diagnoses   Final diagnoses:  Cellulitis of right arm     Discharge Instructions      I have prescribed you an antibiotic for cellulitis of your arm.  Monitor blood sugar more closely while on antibiotic.  Follow-up if any  symptoms persist or worsen.    ED Prescriptions     Medication Sig Dispense Auth. Provider   sulfamethoxazole-trimethoprim (BACTRIM DS) 800-160 MG tablet Take 1 tablet by mouth 2 (two) times daily for 7 days. 14 tablet Start, Acie Fredrickson, [place]      PDMP not reviewed this encounter.   Gustavus Bryant, [place] 07/20/23 1040

## 2023-07-20 NOTE — Discharge Instructions (Signed)
I have prescribed you an antibiotic for cellulitis of your arm.  Monitor blood sugar more closely while on antibiotic.  Follow-up if any symptoms persist or worsen.

## 2023-07-20 NOTE — ED Triage Notes (Addendum)
Pt present right upper arm pain from putting on the dexcom transmitter. Pt states the aware is itching and burning. Pt felt the pain two days ago after placing the sensor on.

## 2023-08-18 ENCOUNTER — Encounter: Payer: Self-pay | Admitting: Physician Assistant

## 2023-08-18 ENCOUNTER — Ambulatory Visit: Payer: BC Managed Care – PPO | Admitting: Physician Assistant

## 2023-08-18 DIAGNOSIS — M1712 Unilateral primary osteoarthritis, left knee: Secondary | ICD-10-CM | POA: Diagnosis not present

## 2023-08-18 MED ORDER — METHYLPREDNISOLONE ACETATE 40 MG/ML IJ SUSP
40.0000 mg | INTRAMUSCULAR | Status: AC | PRN
Start: 2023-08-18 — End: 2023-08-18
  Administered 2023-08-18: 40 mg via INTRA_ARTICULAR

## 2023-08-18 MED ORDER — LIDOCAINE HCL 1 % IJ SOLN
3.0000 mL | INTRAMUSCULAR | Status: AC | PRN
Start: 2023-08-18 — End: 2023-08-18
  Administered 2023-08-18: 3 mL

## 2023-08-18 NOTE — Progress Notes (Signed)
   Procedure Note  Patient: Louis Joseph             Date of Birth: 1961-05-12           MRN: [id number]             Visit Date: 08/18/2023  HPI: Mr. Huinker comes in today requesting a cortisone injection left knee last injection was 01/24/2023.  He states that the injection helped until about a month ago.  No new injury.  He has had no fevers chills.    Review of systems see HPI  Physical exam: General well-developed well-nourished male no acute distress. Left knee good range of motion no abnormal warmth erythema or instability.  Significant patellofemoral crepitus.  Procedures: Visit Diagnoses:  1. Primary osteoarthritis of left knee     Large Joint Inj on 08/18/2023 2:33 PM Indications: pain Details: 22 G 1.5 in needle, anterolateral approach  Arthrogram: No  Medications: 3 mL lidocaine 1 %; 40 mg methylPREDNISolone acetate 40 MG/ML Outcome: tolerated well, no immediate complications Procedure, treatment alternatives, risks and benefits explained, specific risks discussed. Consent was given by the patient. Immediately prior to procedure a time out was called to verify the correct patient, procedure, equipment, support staff and site/side marked as required. Patient was prepped and draped in the usual sterile fashion.     Plan he will follow-up with [place] as needed advised to wait at least 3 months between cortisone injections.  Questions were encouraged and answered at length.

## 2024-03-22 ENCOUNTER — Encounter (HOSPITAL_BASED_OUTPATIENT_CLINIC_OR_DEPARTMENT_OTHER): Payer: Self-pay | Admitting: Emergency Medicine

## 2024-03-22 ENCOUNTER — Emergency Department (HOSPITAL_BASED_OUTPATIENT_CLINIC_OR_DEPARTMENT_OTHER)
Admission: EM | Admit: 2024-03-22 | Discharge: 2024-03-22 | Disposition: A | Discharge status: Home / Self Care | Attending: Emergency Medicine | Admitting: Emergency Medicine

## 2024-03-22 ENCOUNTER — Emergency Department (HOSPITAL_BASED_OUTPATIENT_CLINIC_OR_DEPARTMENT_OTHER)

## 2024-03-22 ENCOUNTER — Other Ambulatory Visit: Payer: Self-pay

## 2024-03-22 DIAGNOSIS — R0781 Pleurodynia: Secondary | ICD-10-CM | POA: Diagnosis not present

## 2024-03-22 DIAGNOSIS — S3992XA Unspecified injury of lower back, initial encounter: Secondary | ICD-10-CM | POA: Diagnosis present

## 2024-03-22 DIAGNOSIS — Z794 Long term (current) use of insulin: Secondary | ICD-10-CM | POA: Diagnosis not present

## 2024-03-22 DIAGNOSIS — Z7982 Long term (current) use of aspirin: Secondary | ICD-10-CM | POA: Diagnosis not present

## 2024-03-22 DIAGNOSIS — W108XXA Fall (on) (from) other stairs and steps, initial encounter: Secondary | ICD-10-CM | POA: Diagnosis not present

## 2024-03-22 DIAGNOSIS — E119 Type 2 diabetes mellitus without complications: Secondary | ICD-10-CM | POA: Insufficient documentation

## 2024-03-22 DIAGNOSIS — Z7984 Long term (current) use of oral hypoglycemic drugs: Secondary | ICD-10-CM | POA: Insufficient documentation

## 2024-03-22 DIAGNOSIS — S20412A Abrasion of left back wall of thorax, initial encounter: Secondary | ICD-10-CM | POA: Insufficient documentation

## 2024-03-22 DIAGNOSIS — W19XXXA Unspecified fall, initial encounter: Secondary | ICD-10-CM

## 2024-03-22 MED ORDER — OXYCODONE HCL 5 MG PO TABS: 5.0000 mg | ORAL_TABLET | ORAL | 0 refills | Status: DC | PRN

## 2024-03-22 MED ORDER — OXYCODONE-ACETAMINOPHEN 5-325 MG PO TABS
1.0000 | ORAL_TABLET | Freq: Once | ORAL | Status: AC
  Administered 2024-03-22: 1 via ORAL
  Filled 2024-03-22: qty 1

## 2024-03-22 NOTE — ED Provider Notes (Signed)
 [place] EMERGENCY DEPARTMENT AT [facility] Provider Note   CSN: [id number] Arrival date & time: 03/22/24  1807     History  Chief Complaint  Patient presents with   Louis Joseph is a 63 y.o. male with noncontributory past medical history presents following a mechanical fall.  States he slipped on his stairs and landed on his left side.  Denies hitting his head or loss of consciousness.  Is not on blood thinners.  Pain is worse with deep breaths.   Fall      Past Medical History:  Diagnosis Date   Colon adenomas    Diabetes mellitus without complication (HCC)    DM w/o complication type II (HCC) 03/19/2015   Dyslipidemia 03/19/2015   GERD (gastroesophageal reflux disease) 03/19/2015   Gilbert's syndrome    Hearing loss    s/p hearing aides   Osteopenia    PUD (peptic ulcer disease)      Home Medications Prior to Admission medications   Medication Sig Start Date End Date Taking? Authorizing Provider  oxyCODONE (ROXICODONE) 5 MG immediate release tablet Take 1 tablet (5 mg total) by mouth every 4 (four) hours as needed for severe pain (pain score 7-10). 03/22/24  Yes Halford Decamp, PA-C  aspirin 81 MG tablet Take 81 mg by mouth daily. Pt takes 1 tablet 4 times a week    [provider]  azithromycin (ZITHROMAX) 250 MG tablet Take 1 tablet (250 mg total) by mouth daily. Take first 2 tablets together, then 1 every day until finished. 05/15/23   Crain, Jodelle Gross, PA  bacitracin-polymyxin b, ophth, (POLYSPORIN) OINT     [provider]  benazepril (LOTENSIN) 5 MG tablet Take 1 tablet by mouth daily. 09/22/18   [provider]  betamethasone acetate-betamethasone sodium phosphate (CELESTONE) 6 (3-3) MG/ML injection Inject into the articular space. 10/23/20   [provider]  Calcium Carb-Cholecalciferol (CALCIUM 600+D3 PO) Take 1 capsule by mouth 2 (two) times daily.    [provider]  Cholecalciferol 25 MCG  (1000 UT) capsule Take by mouth. 08/14/13   [provider]  Continuous Glucose Sensor (FREESTYLE LIBRE 14 DAY SENSOR) MISC  10/02/20   [provider]  Continuous Glucose Sensor (FREESTYLE LIBRE 14 DAY SENSOR) MISC  10/02/20   [provider]  Continuous Glucose Sensor (FREESTYLE LIBRE 3 SENSOR) MISC Apply topically every 14 (fourteen) days. 01/17/23   [provider]  Continuous Glucose Transmitter (DEXCOM G6 TRANSMITTER) MISC SMARTSIG:Topical Every 3 Months 04/12/23   [provider]  Glucagon, rDNA, (GLUCAGON EMERGENCY) 1 MG KIT Glucagon Emergency Kit 1 mg solution for injection 10/23/20   [provider]  glucose blood test strip OneTouch Verio test strips    [provider]  HYDROcodone-acetaminophen (NORCO/VICODIN) 5-325 MG per tablet Take 1-2 tablets by mouth every 4 (four) hours as needed for moderate pain. 09/08/15   Jerald Kief, MD  ibuprofen (ADVIL) 600 MG tablet Take 1 tablet (600 mg total) by mouth every 6 (six) hours as needed. 08/16/22   Rosezella Rumpf, PA-C  Insulin Disposable Pump (OMNIPOD 5 G6 INTRO, GEN 5,) KIT SMARTSIG:SUB-Q Every 3 Days 04/12/23   [provider]  Insulin Disposable Pump (OMNIPOD 5 G6 PODS, GEN 5,) MISC SMARTSIG:SUB-Q Every 3 Days 04/12/23   [provider]  Insulin Human (INSULIN PUMP) 100 unit/ml SOLN Inject into the skin.    [provider]  Insulin Human (INSULIN PUMP) SOLN by  miscellaneous route. 08/14/13   [provider]  Insulin Infusion Pump (MINIMED INSULIN PUMP) DEVI by Misc.(Non-Drug; Combo Route) route.    [provider]  lidocaine (XYLOCAINE) 1 % (with preservative) injection by Infiltration route. 10/23/20   [provider]  metFORMIN (GLUCOPHAGE) 1000 MG tablet Take 1,000 mg by mouth 2 (two) times daily. 03/17/15   [provider]  methocarbamol (ROBAXIN) 500 MG tablet Take 1 tablet (500 mg total) by mouth 2 (two) times daily.  08/16/22   Rosezella Rumpf, PA-C  Mupirocin 2 % KIT     [provider]  nabumetone (RELAFEN) 750 MG tablet Take 1 tablet (750 mg total) by mouth 2 (two) times daily as needed. 06/15/21   Kathryne Hitch, MD  NOVOLOG 100 UNIT/ML injection USE IN INSULIN PUMP 50 UNITS PER DAY 09/22/18   [provider]  pantoprazole (PROTONIX) 40 MG tablet Take 40 mg by mouth 2 (two) times daily. 03/12/15   [provider]  pravastatin (PRAVACHOL) 40 MG tablet Take 20 mg by mouth daily. Pt takes 1/2 tablet by mouth in the evening. 02/13/15   [provider]  saccharomyces boulardii (FLORASTOR) 250 MG capsule Take 1 capsule (250 mg total) by mouth 2 (two) times daily. 09/08/15   Jerald Kief, MD  tiZANidine (ZANAFLEX) 4 MG tablet Take 1 tablet (4 mg total) by mouth every 8 (eight) hours as needed for muscle spasms. 06/15/21   Kathryne Hitch, MD  traMADol (ULTRAM) 50 MG tablet Take 1 tablet (50 mg total) by mouth every 6 (six) hours as needed for moderate pain. 07/14/16   Linna Hoff, MD      Allergies    Codeine    Review of Systems   Review of Systems  Musculoskeletal:  Positive for myalgias.    Physical Exam Updated Vital Signs BP (!) 159/80 (BP Location: Right Arm)   Pulse 75   Temp 98.4 F (36.9 C)   Resp (!) 24   SpO2 99%  Physical Exam Vitals and nursing note reviewed.  Constitutional:      General: He is not in acute distress.    Appearance: He is well-developed.  HENT:     Head: Normocephalic and atraumatic.  Eyes:     Conjunctiva/sclera: Conjunctivae normal.  Cardiovascular:     Rate and Rhythm: Normal rate and regular rhythm.     Heart sounds: No murmur heard. Pulmonary:     Effort: Pulmonary effort is normal. No respiratory distress.     Breath sounds: Normal breath sounds.  Abdominal:     Palpations: Abdomen is soft.     Tenderness: There is no abdominal tenderness.  Musculoskeletal:        General: No swelling.     Cervical  back: Neck supple.     Comments: Superficial abrasions noted to left torso and thoracic paraspinal region.  No midline spinal tenderness.  Exquisite rib tenderness  Skin:    General: Skin is warm and dry.     Capillary Refill: Capillary refill takes less than 2 seconds.  Neurological:     Mental Status: He is alert.  Psychiatric:        Mood and Affect: Mood normal.     ED Results / Procedures / Treatments   Labs (all labs ordered are listed, but only abnormal results are displayed) Labs Reviewed - No data to display  EKG None  Radiology DG Chest [facility] 1 View Result Date: 03/22/2024 CLINICAL DATA:  Difficulty  breathing after fall. EXAM: PORTABLE CHEST 1 VIEW COMPARISON:  May 15, 2023. FINDINGS: The heart size and mediastinal contours are within normal limits. Hypoinflation of the lungs with minimal bibasilar subsegmental atelectasis. The visualized skeletal structures are unremarkable. IMPRESSION: Hypoinflation of the lungs with minimal bibasilar subsegmental atelectasis. Electronically Signed   By: Lupita Raider M.D.   On: 03/22/2024 19:03    Procedures Procedures    Medications Ordered in ED Medications  oxyCODONE-acetaminophen (PERCOCET/ROXICET) 5-325 MG per tablet 1 tablet (1 tablet Oral Given 03/22/24 1843)    ED Course/ Medical Decision Making/ A&P                                 Medical Decision Making Amount and/or Complexity of Data Reviewed Radiology: ordered.   This patient presents to the ED with chief complaint(s) of mechanical fall.  The complaint involves an extensive differential diagnosis and also carries with it a high risk of complications and morbidity.   pertinent past medical history as listed in HPI  The differential diagnosis includes  Fracture, dislocation, sprain The initial plan is to  [place] plain films chest Additional history obtained: Records reviewed Care Everywhere/External Records  Initial Assessment:   Patient presents following  mechanical fall with complaints of left sided rib pain.  On exam he does have superficial abrasions to the area with exquisite tenderness.  No step-offs or gross deformities noted.  Chest x-ray is without any obvious fractures.  Pain is worse with any movement or respirations.  Overall consistent with rib contusion versus occult rib fracture.  He is up-to-date on tetanus.  Will clean up wounds and discharge home with pain medication and incentive spirometry.  Independent ECG interpretation:  none  Independent labs interpretation:  The following labs were independently interpreted:  none  Independent visualization and interpretation of imaging: I independently visualized the following imaging with scope of interpretation limited to determining acute life threatening conditions related to emergency care: Chest x-ray, which revealed hypoinflation of the lungs with no obvious osseous abnormality  Treatment and Reassessment: Patient given Percocet for following triage  Consultations obtained:   none  Disposition:   Patient be discharged home.  Provided incentive spirometry.  Short course of oxycodone sent into pharmacy.  Encouraged to follow-up PCP should symptoms persist. The patient has been appropriately medically screened and/or stabilized in the ED. I have low suspicion for any other emergent medical condition which would require further screening, evaluation or treatment in the ED or require inpatient management. At time of discharge the patient is hemodynamically stable and in no acute distress. I have discussed work-up results and diagnosis with patient and answered all questions. Patient is agreeable with discharge plan. We discussed strict return precautions for returning to the emergency department and they verbalized understanding.     Social Determinants of Health:   none  This note was dictated with voice recognition software.  Despite best efforts at proofreading, errors may have  occurred which can change the documentation meaning.          Final Clinical Impression(s) / ED Diagnoses Final diagnoses:  Fall, initial encounter  Rib pain    Rx / DC Orders ED Discharge Orders          Ordered    oxyCODONE (ROXICODONE) 5 MG immediate release tablet  Every 4 hours PRN        03/22/24 1928  Halford Decamp, PA-C 03/22/24 1929    Virgina Norfolk, DO 03/22/24 2247

## 2024-03-22 NOTE — Discharge Instructions (Addendum)
 You were evaluated in the emergency room following a fall.  Your x-rays did not show any obvious rib fractures.  Prescription for oxycodone was sent into your pharmacy.  You may additionally use Tylenol 1000 mg every 4-6 hours and/or Motrin 600 mg every 4-6 hours on a full stomach.  Please keep in mind that this dosage should not be continued long-term.  Please follow-up with your primary care doctor to ensure your symptoms are improving.

## 2024-03-22 NOTE — ED Notes (Signed)
 Patient wound cleansed and bacitracin ointment applied.

## 2024-03-22 NOTE — ED Triage Notes (Signed)
 Fell when walking down steps and landed on left rib cage. Denies hitting head. States hard to breath after fall.

## 2024-03-22 NOTE — ED Notes (Signed)
 RT educated pt on proper use of IS. Pt goal set at 1250 mL pt able to achieve 1500 mLs w/good effort.    03/22/24 2000  Incentive Spirometry  IS Goal (mL) (RN or RT) 1250 mL  IS - Achieved (mL) (RN, NT, or RT) 1500 mL  IS - # of Times (RN or NT) 10  IS Effort (RN) Satisfactory  IS Use (NT) Observed, patient had no questions  Respiratory  Respiratory Interventions Incentive spirometry w/ teach back

## 2024-06-04 ENCOUNTER — Other Ambulatory Visit (INDEPENDENT_AMBULATORY_CARE_PROVIDER_SITE_OTHER): Payer: Self-pay

## 2024-06-04 ENCOUNTER — Ambulatory Visit: Admitting: Physician Assistant

## 2024-06-04 ENCOUNTER — Encounter: Payer: Self-pay | Admitting: Physician Assistant

## 2024-06-04 DIAGNOSIS — M1712 Unilateral primary osteoarthritis, left knee: Secondary | ICD-10-CM

## 2024-06-04 MED ORDER — LIDOCAINE HCL 1 % IJ SOLN
3.0000 mL | INTRAMUSCULAR | Status: AC | PRN
Start: 2024-06-04 — End: 2024-06-04
  Administered 2024-06-04: 3 mL

## 2024-06-04 MED ORDER — METHYLPREDNISOLONE ACETATE 40 MG/ML IJ SUSP: 40.0000 mg | INTRAMUSCULAR | Status: AC | PRN

## 2024-06-04 NOTE — Progress Notes (Signed)
   Procedure Note  Patient: Louis Joseph             Date of Birth: July 24, 1961           MRN: [id number]             Visit Date: 06/04/2024  HPI: Mr. Soberano comes in today due to left knee pain.  Pains been present for the past month no new injury.  Last injection left knee was 08/18/2023.  He has pain mostly medial aspect the knee notes stiffness and a constant achiness.  Pain is worse with going up and down stairs.  He has tried ibuprofen  and Tylenol . He is diabetic and reports good control of his diabetes.  Review of systems Negative for fevers chills or ongoing infection  Physical exam: General Well-developed well-nourished male no acute distress ambulates without any assistive device. Bilateral knees: No abnormal warmth erythema.  Slight effusion left knee.  Tenderness at medial joint line.  No instability of either knee with valgus varus stressing. Radiographs: Left knee 2 views: Knee is well located.  Moderate to moderately severe narrowing medial joint line.  Moderate patellofemoral changes.  Lateral compartment overall well-preserved.  No acute fractures acute findings.  Procedures: Visit Diagnoses:  1. Primary osteoarthritis of left knee     Large Joint Inj on 06/04/2024 5:30 PM Indications: pain Details: 22 G 1.5 in needle, anterolateral approach  Arthrogram: No  Medications: 3 mL lidocaine  1 %; 40 mg methylPREDNISolone  acetate 40 MG/ML Aspirate: 10 mL yellow Outcome: tolerated well, no immediate complications Procedure, treatment alternatives, risks and benefits explained, specific risks discussed. Consent was given by the patient. Immediately prior to procedure a time out was called to verify the correct patient, procedure, equipment, support staff and site/side marked as required. Patient was prepped and draped in the usual sterile fashion.     Plan: He knows to wait at least 3 months between injections with cortisone.  Follow-up as needed.  Questions encouraged and  answered.

## 2024-08-29 ENCOUNTER — Encounter: Payer: Self-pay | Admitting: Physician Assistant

## 2024-08-29 ENCOUNTER — Other Ambulatory Visit (INDEPENDENT_AMBULATORY_CARE_PROVIDER_SITE_OTHER)

## 2024-08-29 ENCOUNTER — Ambulatory Visit: Admitting: Physician Assistant

## 2024-08-29 DIAGNOSIS — M2550 Pain in unspecified joint: Secondary | ICD-10-CM | POA: Diagnosis not present

## 2024-08-29 DIAGNOSIS — M25512 Pain in left shoulder: Secondary | ICD-10-CM

## 2024-08-29 DIAGNOSIS — M1712 Unilateral primary osteoarthritis, left knee: Secondary | ICD-10-CM

## 2024-08-29 LAB — COMPREHENSIVE METABOLIC PANEL WITH GFR: EGFR: 75.2

## 2024-08-29 MED ORDER — LIDOCAINE HCL 1 % IJ SOLN
3.0000 mL | INTRAMUSCULAR | Status: AC | PRN
  Administered 2024-08-29: 3 mL

## 2024-08-29 MED ORDER — METHYLPREDNISOLONE ACETATE 40 MG/ML IJ SUSP
40.0000 mg | INTRAMUSCULAR | Status: AC | PRN
  Administered 2024-08-29: 40 mg via INTRA_ARTICULAR

## 2024-08-29 NOTE — Progress Notes (Signed)
 Office Visit Note   Patient: Louis Joseph           Date of Birth: 09/19/1961           MRN: [id number] Visit Date: 08/29/2024              Requested by: Louis Anes, MD [address] [place],  [place] [address] PCP: Louis Anes, MD   Assessment & Plan: Visit Diagnoses:  1. Acute pain of left shoulder   2. Multiple joint pain   3. Primary osteoarthritis of left knee     Plan:  Will send him to formal physical therapy for his shoulders to work on range of motion, strengthening, they will include home exercise program and modalities.  Also draw a rheumatoid panel given his multiple arthralgias.  And follow-up with Louis Joseph in 4 weeks he is doing overall.  Did provide him with a left knee injection today he will monitor his glucose levels closely over the next 24 to 48 hours.  Questions were encouraged and answered at length.  Follow-Up Instructions: Return in about 4 weeks (around 09/26/2024), or Louis Joseph.   Orders:  Orders Placed This Encounter  Procedures   Large Joint Inj   XR Shoulder Left   Antinuclear Antib (ANA)   Uric acid   Sed Rate (ESR)   Rheumatoid Factor   No orders of the defined types were placed in this encounter.     Procedures: Large Joint Inj on 08/29/2024 8:19 AM Indications: pain Details: 22 G 1.5 in needle, anterolateral approach  Arthrogram: No  Medications: 3 mL lidocaine  1 %; 40 mg methylPREDNISolone  acetate 40 MG/ML Outcome: tolerated well, no immediate complications Procedure, treatment alternatives, risks and benefits explained, specific risks discussed. Consent was given by the patient. Immediately prior to procedure a time out was called to verify the correct patient, procedure, equipment, support staff and site/side marked as required. Patient was prepped and draped in the usual sterile fashion.       Clinical Data: No additional findings.   Subjective: Chief Complaint  Patient presents with   Left Knee - Pain    Left Shoulder - Pain    HPI Louis Joseph comes in today due to left knee pain.  Again he has known arthritis of his left knee.  Last had a cortisone injection 06/04/2024.  He noted that he has been doing a lot of aches and pains first thing in the morning goes away after an hour or so.  He has had no known injury.  He is now having shoulder pain bilaterally left greater than right.  No radicular symptoms down either arm.  No neck pain.  He states that shoulder pain that is waking him up particularly the left side.  Reports that his hemoglobin A1c was 7.0 at last check.  Review of Systems  Constitutional:  Negative for chills and fever.  Musculoskeletal:  Positive for arthralgias and myalgias.  Skin:  Negative for rash.     Objective: Vital Signs: There were no vitals taken for this visit.  Physical Exam Constitutional:      Appearance: He is not ill-appearing or diaphoretic.  Pulmonary:     Effort: Pulmonary effort is normal.  Neurological:     Mental Status: He is alert and oriented to person, place, and time.  Psychiatric:        Mood and Affect: Mood normal.   Skin: Bilateral elbows and knees there is no rashes skin lesions.  There  is some callus formation noted to the anterior aspect of both knees at the inferior pole.  Ortho Exam Cervical spine: Good range of motion cervical spine without pain.  Negative Spurling's.  Nontender over the cervical spinal column.  Tenderness medial border of bilateral scapula near the inferior border. Upper extremities: 5 out of 5 strength throughout the upper extremities except for lift off exam bilateral shoulders is positive. Bilateral shoulders: Forward flexion actively is quite painful he is able to abduct to 70 degrees.  Positive impingement testing bilateral shoulders.  Out of 5 strength external and internal rotation against resistance bilaterally.  Empty can test is negative bilaterally.  Nontender over the [facility] joints.  Liftoff test is positive  bilaterally. Bilateral knees: Right knee no abnormal warmth erythema or effusion.  Full range of motion right knee without pain.  Left knee slight effusion no abnormal warmth erythema.  Lacks the last few degrees in full extension and has full flexion.  Tenderness peripatellar region.  Specialty Comments:  No specialty comments available.  Imaging: XR Shoulder Left Result Date: 08/29/2024 Left shoulder 3 views: Shoulder is well located.  Glenohumeral joint is well-maintained.  No significant arthritic changes about the shoulder girdle.  No acute fractures acute findings.  Subacromial space well-maintained.    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   Trigger finger of right thumb 06/14/2019   Hand arthritis 11/21/2018   Foreign body (FB) in soft tissue 11/21/2018   Metatarsalgia 07/26/2018   Arthritis of left acromioclavicular joint 04/10/2018   Acute colitis 09/08/2015   Fever 09/06/2015   Nausea vomiting and diarrhea 09/06/2015   Headache 09/06/2015   Sepsis (HCC) 09/06/2015   AKI (acute kidney injury) (HCC) 09/06/2015   Bad headache    Dyslipidemia 03/19/2015   GERD (gastroesophageal reflux disease) 03/19/2015   SOB (shortness of breath) 03/19/2015   DM w/o complication type II (HCC) 03/19/2015   Louis Joseph syndrome    Osteopenia    PUD (peptic ulcer disease)    Colon adenomas    Hearing loss    Knee pain 08/14/2013   Past Medical History:  Diagnosis Date   Colon adenomas    Diabetes mellitus without complication (HCC)    DM w/o complication type II (HCC) 03/19/2015   Dyslipidemia 03/19/2015   GERD (gastroesophageal reflux disease) 03/19/2015   Louis Joseph syndrome    Hearing loss    s/p hearing aides   Osteopenia    PUD (peptic ulcer disease)     Family History   Problem Relation Age of Onset   Diabetes Other    Uterine cancer Mother    Heart disease Father    Diabetes Father    Hypertension Father    Hypertension Sister     Past Surgical History:  Procedure Laterality Date   COLONOSCOPY N/A 12/02/2014   Procedure: COLONOSCOPY;  Surgeon: Gladis MARLA Louder, MD;  Location: [facility];  Service: Endoscopy;  Laterality: N/A;   HAND SURGERY     KNEE SURGERY     Social History   Occupational History   Not on file  Tobacco Use   Smoking status: Never   Smokeless tobacco: Never  Vaping Use   Vaping status: Never Used  Substance and Sexual Activity   Alcohol use: No   Drug use: No   Sexual  activity: Not on file

## 2024-08-30 LAB — ANA: Anti Nuclear Antibody (ANA): NEGATIVE

## 2024-08-30 LAB — RHEUMATOID FACTOR: Rheumatoid fact SerPl-aCnc: <10 [IU]/mL (ref <14)

## 2024-08-30 LAB — URIC ACID: Uric Acid, Serum: 4.6 mg/dL (ref 4.0–8.0)

## 2024-08-30 LAB — SEDIMENTATION RATE: Sed Rate: 6 mm/h (ref 0–20)

## 2024-09-04 ENCOUNTER — Ambulatory Visit: Payer: Self-pay | Admitting: Physician Assistant

## 2024-09-26 ENCOUNTER — Ambulatory Visit: Admitting: Orthopaedic Surgery

## 2024-10-15 NOTE — Progress Notes (Deleted)
 Office Visit Note  Patient: Louis Joseph             Date of Birth: [birth year]           MRN: [id number]             PCP: Shayne Anes, MD Referring: Verta Izetta HERO, NP Visit Date: 10/25/2024 Occupation: Data Unavailable  Subjective:  No chief complaint on file.   History of Present Illness: Louis Joseph is a 63 y.o. male   Concern for PMR  08/29/24: CRP 18, RF-, ESR 5, uric acid 4.6, CCP 7, ANA negative  80 mg Depo medrol  IM injection 08/31/24  Prednisone taper prescribed     Activities of Daily Living:  Patient reports morning stiffness for *** {minute/hour:19697}.   Patient {ACTIONS;DENIES/REPORTS:21021675::Denies} nocturnal pain.  Difficulty dressing/grooming: {ACTIONS;DENIES/REPORTS:21021675::Denies} Difficulty climbing stairs: {ACTIONS;DENIES/REPORTS:21021675::Denies} Difficulty getting out of chair: {ACTIONS;DENIES/REPORTS:21021675::Denies} Difficulty using hands for taps, buttons, cutlery, and/or writing: {ACTIONS;DENIES/REPORTS:21021675::Denies}  No Rheumatology ROS completed.   PMFS History:  Patient Active Problem List   Diagnosis Date Noted  . 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 Nuclear sclerotic cataract of both eyes 11/03/2020  . 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 Trigger finger of right thumb 06/14/2019  . 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 Foreign body (FB) in soft tissue 11/21/2018  . Metatarsalgia 07/26/2018  . Arthritis of left acromioclavicular joint 04/10/2018  . Acute colitis 09/08/2015  . Fever 09/06/2015  . Nausea vomiting and diarrhea 09/06/2015  . Headache 09/06/2015  . Sepsis (HCC) 09/06/2015  . AKI (acute kidney injury) 09/06/2015  . Bad headache   . Dyslipidemia 03/19/2015  . GERD (gastroesophageal reflux disease) 03/19/2015  . SOB (shortness of breath) 03/19/2015  . DM w/o complication type II (HCC) 03/19/2015  . Gilbert's syndrome   . Osteopenia   . PUD  (peptic ulcer disease)   . Colon adenomas   . Hearing loss   . Knee pain 08/14/2013    Past Medical History:  Diagnosis Date  . Colon adenomas   . Diabetes mellitus without complication (HCC)   . DM w/o complication type II (HCC) 03/19/2015  . Dyslipidemia 03/19/2015  . GERD (gastroesophageal reflux disease) 03/19/2015  . Gilbert's syndrome   . Hearing loss    s/p hearing aides  . Osteopenia   . PUD (peptic ulcer disease)     Family History  Problem Relation Age of Onset  . Diabetes Other   . Uterine cancer Mother   . Heart disease Father   . Diabetes Father   . Hypertension Father   . Hypertension Sister    Past Surgical History:  Procedure Laterality Date  . COLONOSCOPY N/A 12/02/2014   Procedure: COLONOSCOPY;  Surgeon: Gladis MARLA Louder, MD;  Location: [facility];  Service: Endoscopy;  Laterality: N/A;  . HAND SURGERY    . KNEE SURGERY     Social History   Tobacco Use  . Smoking status: Never  . Smokeless tobacco: Never  Vaping Use  . Vaping status: Never Used  Substance Use Topics  . Alcohol use: No  . Drug use: No   Social History   Social History Narrative  . Not on file      There is no immunization history on file for this patient.   Objective: Vital Signs: There were no vitals taken for this visit.   Physical Exam Vitals and nursing note reviewed.  Constitutional:      Appearance: He is well-developed.  HENT:     Head: Normocephalic and atraumatic.  Eyes:     Conjunctiva/sclera: Conjunctivae normal.     Pupils: Pupils are equal, round, and reactive to light.  Cardiovascular:     Rate and Rhythm: Normal rate and regular rhythm.     Heart sounds: Normal heart sounds.  Pulmonary:     Effort: Pulmonary effort is normal.     Breath sounds: Normal breath sounds.  Abdominal:     General: Bowel sounds are normal.     Palpations: Abdomen is soft.  Musculoskeletal:     Cervical back: Normal range of motion and neck supple.  Skin:    General:  Skin is warm and dry.     Capillary Refill: Capillary refill takes less than 2 seconds.  Neurological:     Mental Status: He is alert and oriented to person, place, and time.  Psychiatric:        Behavior: Behavior normal.     Musculoskeletal Exam: ***  CDAI Exam: CDAI Score: -- Patient Global: --; Provider Global: -- Swollen: --; Tender: -- Joint Exam 10/25/2024   No joint exam has been documented for this visit   There is currently no information documented on the homunculus. Go to the Rheumatology activity and complete the homunculus joint exam.  Investigation: No additional findings.  Imaging: No results found.  Recent Labs: Lab Results  Component Value Date   WBC 8.9 08/16/2022   HGB 15.4 08/16/2022   PLT 268 08/16/2022   NA 136 08/16/2022   K 4.0 08/16/2022   CL 102 08/16/2022   CO2 22 08/16/2022   GLUCOSE 164 (H) 08/16/2022   BUN 12 08/16/2022   CREATININE 0.87 08/16/2022   BILITOT 0.9 08/16/2022   ALKPHOS 68 08/16/2022   AST 17 08/16/2022   ALT 13 08/16/2022   PROT 6.8 08/16/2022   ALBUMIN 4.5 08/16/2022   CALCIUM  9.5 08/16/2022   GFRAA >60 09/08/2015    Speciality Comments: No specialty comments available.  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  No orders of the  defined types were placed in this encounter.   Face-to-face time spent with patient was *** minutes. Greater than 50% of time was spent in counseling and coordination of care.  Follow-Up Instructions: No follow-ups on file.   Waddell CHRISTELLA Craze, PA-C  Note - This record has been created using Dragon software.  Chart creation errors have been sought, but may not always  have been located. Such creation errors do not reflect on  the standard of medical care.

## 2024-10-22 ENCOUNTER — Encounter: Payer: Self-pay | Admitting: Radiology

## 2024-10-25 ENCOUNTER — Encounter: Admitting: Physician Assistant

## 2024-10-25 DIAGNOSIS — K219 Gastro-esophageal reflux disease without esophagitis: Secondary | ICD-10-CM

## 2024-10-25 DIAGNOSIS — M2550 Pain in unspecified joint: Secondary | ICD-10-CM

## 2024-10-25 DIAGNOSIS — H35043 Retinal micro-aneurysms, unspecified, bilateral: Secondary | ICD-10-CM

## 2024-10-25 DIAGNOSIS — E785 Hyperlipidemia, unspecified: Secondary | ICD-10-CM

## 2024-10-25 DIAGNOSIS — H2513 Age-related nuclear cataract, bilateral: Secondary | ICD-10-CM

## 2024-10-25 DIAGNOSIS — K279 Peptic ulcer, site unspecified, unspecified as acute or chronic, without hemorrhage or perforation: Secondary | ICD-10-CM

## 2024-10-25 DIAGNOSIS — D126 Benign neoplasm of colon, unspecified: Secondary | ICD-10-CM

## 2024-10-25 DIAGNOSIS — E113393 Type 2 diabetes mellitus with moderate nonproliferative diabetic retinopathy without macular edema, bilateral: Secondary | ICD-10-CM

## 2024-10-25 DIAGNOSIS — G8929 Other chronic pain: Secondary | ICD-10-CM

## 2024-10-25 DIAGNOSIS — M19012 Primary osteoarthritis, left shoulder: Secondary | ICD-10-CM

## 2024-10-26 NOTE — Progress Notes (Unsigned)
 Office Visit Note  Patient: Louis Joseph             Date of Birth: 04/18/1961           MRN: [id number]             PCP: Louis Anes, MD Referring: Verta Izetta HERO, NP Visit Date: 10/30/2024 Occupation: Data Unavailable  Subjective:  Pain and stiffness involving multiple joints   History of Present Illness: Louis Joseph is a 63 y.o. male who presents today for a new patient consultation.  Patient presents today for further evaluation of pain and stiffness involving multiple joints.  His symptoms initially started about 6 months ago but have progressively been worsening over the past 1 month.  Patient has been experiencing morning stiffness lasting until lunchtime.  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  He has been experiencing nocturnal pain in both shoulders as well as difficulty rising from a seated position especially to sitting for prolonged peers of time due to discomfort in his hips, knees, and ankle joints.  Patient is aware that he needs a left knee replacement and has had intermittent swelling in the past.  He has had a knee joint aspiration previously as well as arthroscopic surgery performed on the left knee about 25 years ago.  He can tell at times he has warmth and swelling in the left knee but is not ready to proceed with surgical intervention at this time.  He has been having to take Tylenol  twice daily for symptomatic relief.  He has noticed increased stiffness in both hands and has difficulty making complete fist.  He has also noticed decreased grip strength in both hands especially first thing in the mornings.  Patient has had trigger finger release for several digits in the past.  He is currently having locking of the left middle finger. Patient states that there was  concern for possible polymyalgia rheumatica.  He was given an IM Depo-Medrol  injection on 08/31/2024 as well as a prednisone taper.  His symptoms have been highly responsive to prednisone use.  He has been off of prednisone for at least 1 month.  Patient has been unable to golf for the past 3 months due to severity of symptoms. He denies any known family history of any rheumatologic conditions.  He denies any personal history of psoriasis, Crohn's disease, ulcerative colitis, or uveitis. He denies any oral or nasal ulcerations.  He denies any sicca symptoms.  He denies any symptoms of Raynaud's phenomenon, or rashes.   Activities of Daily Living:  Patient reports morning stiffness for 3-4 hours.   Patient Reports nocturnal pain.  Difficulty dressing/grooming: Denies Difficulty climbing stairs: Reports Difficulty getting out of chair: Reports Difficulty using hands for taps, buttons, cutlery, and/or writing: Reports  Review of Systems  Constitutional:  Positive for fatigue.  HENT:  Negative for mouth sores and mouth dryness.   Eyes:  Negative for dryness.  Respiratory:  Negative for shortness of breath.   Cardiovascular:  Negative for chest pain and palpitations.  Gastrointestinal:  Negative for blood in stool, constipation and diarrhea.  Endocrine: Negative for increased urination.  Genitourinary:  Negative for involuntary urination.  Musculoskeletal:  Positive for joint pain, joint pain, joint swelling, myalgias, muscle weakness, morning stiffness, muscle tenderness and  myalgias. Negative for gait problem.  Skin:  Negative for color change, rash and sensitivity to sunlight.  Allergic/Immunologic: Positive for susceptible to infections.  Neurological:  Negative for dizziness and headaches.  Hematological:  Negative for swollen glands.  Psychiatric/Behavioral:  Positive for sleep disturbance. Negative for depressed mood. The patient is not nervous/anxious.     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) 11/03/2020   Nuclear sclerotic cataract of both eyes 11/03/2020   Retinal microaneurysm of both eyes 11/03/2020   Trigger finger of right thumb 06/14/2019   Hand arthritis 11/21/2018   Foreign body (FB) in soft tissue 11/21/2018   Metatarsalgia 07/26/2018   Arthritis of left acromioclavicular joint 04/10/2018   Acute colitis 09/08/2015   Fever 09/06/2015   Nausea vomiting and diarrhea 09/06/2015   Headache 09/06/2015   Sepsis (HCC) 09/06/2015   AKI (acute kidney injury) 09/06/2015   Bad headache    Dyslipidemia 03/19/2015   GERD (gastroesophageal reflux disease) 03/19/2015   SOB (shortness of breath) 03/19/2015   DM w/o complication type II (HCC) 03/19/2015   Gilbert's syndrome    Osteopenia    PUD (peptic ulcer disease)    Colon adenomas    Hearing loss    Knee pain 08/14/2013    Past Medical History:  Diagnosis Date   Colon adenomas    Diabetes mellitus without complication (HCC)    DM w/o complication type II (HCC) 03/19/2015   Dyslipidemia 03/19/2015   GERD (gastroesophageal reflux disease) 03/19/2015   Gilbert's syndrome    Hearing loss    s/p hearing aides   Osteopenia    PUD (peptic ulcer disease)     Family History  Problem Relation Age of Onset   Uterine cancer Mother    Heart disease Father    Diabetes Father    Hypertension Father    Hypertension Sister    Leukemia Maternal Grandmother    Heart disease Paternal Grandmother    Heart disease Paternal Grandfather    Diabetes Other    Past Surgical History:  Procedure Laterality Date   COLONOSCOPY N/A 12/02/2014   Procedure: COLONOSCOPY;  Surgeon: Gladis MARLA Louder, MD;  Location: [facility];  Service: Endoscopy;  Laterality: N/A;   HAND SURGERY     KNEE SURGERY     Social History   Tobacco Use   Smoking status: Former    Current packs/day: 0.50    Types: Cigarettes    Passive exposure:  Never   Smokeless tobacco: Never  Vaping Use   Vaping status: Never Used  Substance Use Topics   Alcohol use: No   Drug use: No   Social History   Social History Narrative   Not on file      There is no immunization history on file for this patient.   Objective: Vital Signs: BP (!) 144/81 (BP Location: Right Arm, Patient Position: Sitting, Cuff Size: Normal)   Pulse 76   Temp 98.4 F (36.9 C)   Resp 13   Ht 5' 10 (1.778 m)   Wt 199 lb 9.6 oz (90.5 kg)   BMI 28.64 kg/m    Physical Exam Vitals and nursing note reviewed.  Constitutional:      Appearance: He is well-developed.  HENT:     Head: Normocephalic and atraumatic.     Comments: No temporal artery tenderness    Mouth/Throat:     Comments: No tenderness of  TMJ, bilaterally  Eyes:     Conjunctiva/sclera: Conjunctivae normal.     Pupils: Pupils are equal, round, and reactive to light.  Cardiovascular:     Rate and Rhythm: Normal rate and regular rhythm.     Heart sounds: Normal heart sounds.  Pulmonary:     Effort: Pulmonary effort is normal.     Breath sounds: Normal breath sounds.  Abdominal:     General: Bowel sounds are normal.     Palpations: Abdomen is soft.  Musculoskeletal:     Cervical back: Normal range of motion and neck supple.  Skin:    General: Skin is warm and dry.     Capillary Refill: Capillary refill takes less than 2 seconds.  Neurological:     Mental Status: He is alert and oriented to person, place, and time.  Psychiatric:        Behavior: Behavior normal.      Musculoskeletal Exam: C-spine has some stiffness with ROM.  Tenderness along the paraspinal muscles in the right lower lumbar region.  No midline spinal tenderness.  No SI joint tenderness. Painful ROM of both shoulder with stiffness attempting full abduction.  No tenderness or swelling along the elbow joint line.  No medial or lateral epicondylitis.  Limited extension of both wrist joints with mild tenderness bilaterally.   First MCP joint thickening bilaterally.  PIP and DIP thickening consistent with osteoarthritis of both hands.  Left middle trigger finger noted.  Difficulty making a complete fist with both hands. Discomfort with range of motion of both hips especially with abduction.  No discomfort with hip flexion.  Painful range of motion of the left knee with crepitus.  No effusion of knee joints noted today.  Ankle joints have good range of motion with no tenderness upon palpation.  No evidence of Achilles tendinitis or plantar fasciitis.  No tenderness or synovitis of MTP joints.  PIP and DIP thickening consistent with osteoarthritis of both feet.  CDAI Exam: CDAI Score: -- Patient Global: --; Provider Global: -- Swollen: --; Tender: -- Joint Exam 10/30/2024   No joint exam has been documented for this visit   There is currently no information documented on the homunculus. Go to the Rheumatology activity and complete the homunculus joint exam.  Investigation: No additional findings.  Imaging: No results found.  Recent Labs: Lab Results  Component Value Date   WBC 8.9 08/16/2022   HGB 15.4 08/16/2022   PLT 268 08/16/2022   NA 136 08/16/2022   K 4.0 08/16/2022   CL 102 08/16/2022   CO2 22 08/16/2022   GLUCOSE 164 (H) 08/16/2022   BUN 12 08/16/2022   CREATININE 0.87 08/16/2022   BILITOT 0.9 08/16/2022   ALKPHOS 68 08/16/2022   AST 17 08/16/2022   ALT 13 08/16/2022   PROT 6.8 08/16/2022   ALBUMIN 4.5 08/16/2022   CALCIUM  9.5 08/16/2022   GFRAA >60 09/08/2015    Speciality Comments: No specialty comments available.  Procedures:  No procedures performed Allergies: Codeine  Concern for PMR  08/29/24: CRP 18, RF-, ESR 5, uric acid 4.6, CCP 7, ANA negative  80 mg Depo medrol  IM injection 08/31/24  Prednisone taper prescribed    Assessment / Plan:     Visit Diagnoses: Multiple joint pain - x6 months-evaluated by Dr. Shayne (PCP) and Bertrum Gaskins, PA-C (ortho) Lab work from Dr.  Sheppard office 08/29/24: creatinine 1.0, GFR 75.2, AST WNL, ALT WNL, WBC count 11.20, uric acid 4.6, ESR 5, RF<10, CRP 18,  Anti-CCP<7, ANA negative. Lab work available in Epic: 08/29/24: RF-, ESR 6, uric acid 4.6, ANA negative. Responsive to prednisone use (oral taper and IM depo-medrol  injection): Patient presents today for further evaluation of pain and stiffness affecting multiple joints.  Symptoms started 6 months ago but have progressively been worsening especially over the past 1 month.  He is having pain and stiffness affecting multiple joints including both shoulders, both hands, both hips, both knees, and both ankle joints.  His morning stiffness has been lasting until lunchtime and then typically recurs in the evenings once he is more sedentary after work.  In the middle of the night, he walks with a shuffle due to the pain and stiffness in his hips and knees. He has been taking Tylenol  arthritis twice daily for symptomatic relief.  His symptoms have been responsive to IM Depo-Medrol  injection and a prednisone taper which was prescribed on 08/31/2024.  There has been concern about possible polymyalgia rheumatica.  CRP was elevated at 18 and ESR was within normal limits-5 on 08/29/2024.  Plan to recheck sed rate and CRP today.  No temporal artery tenderness noted.  No tenderness of the TMJ.  No jaw claudication. No vision changes. No muscular weakness was noted on examination today but plan to still check CK. No personal history of psoriasis, Crohn's disease, ulcerative colitis, or uveitis. RF and anti-CCP were negative on 08/29/2024.  Plan to check MCV today. Discussed the concern for chronic inflammatory arthritis such as seronegative RA versus PMR.  Plan to update x-rays of both hands and both hips today.  Reviewed x-ray results of the left shoulder and left knee. Plan to discuss treatment options at the new patient follow-up visit.  Plan to obtain baseline immunosuppressive labs today in anticipation  of starting a medication as a steroid sparing agent.  Discussed the concern for long-term systemic corticosteroid use given history of type 2 diabetes. Plan to discuss results in detail at the new patient follow-up visit.  - Plan: XR HIPS BILAT W OR W/O PELVIS 3-4 VIEWS, Sedimentation rate, C-reactive protein, CK  Pain in both hands - He has been experiencing increased pain and stiffness affecting both hands.  He is right-hand dominant.  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   He has noticed progressively worsening stiffness especially first thing in the mornings over the past 6 months.  He has difficulty making a complete fist and has noticed decreased grip strength in his hands.   RF and anti-CCP were negative on 08/29/2024.  On examination he has thickening of bilateral first MCP joints as well as PIP and DIP thickening.  Difficulty making a complete fist bilaterally. Plan to update x-rays of both hands today for further evaluation.  Plan to also obtain the following lab work.  Plan: XR Hand 2 View Left, XR Hand 2 View Right, Mutated Citrullinated Vimentin (MCV) Antibody  Bilateral hip pain - He has been experiencing increased pain and stiffness affecting both hips x 6 months.  His symptoms have been most severe in the middle of the night and first thing in the mornings.  His morning stiffness has been lasting until lunchtime most days.  He was able to rise from a seated position today.  He has good range of motion of the hip joints on exam with some discomfort bilaterally.  No tenderness over the trochanteric bursa.   X-rays of both hips were obtained today for further  evaluation.  Plan to update the following lab work today.  Discussed the concern for an inflammatory arthropathy versus polymyalgia rheumatica.   His symptoms have been responsive to prednisone in the past.  Discussed the concern for long-term systemic corticosteroid  use given history of type 2 diabetes requiring insulin .   Different treatment options will be discussed at the new patient follow-up visit.  We would likely need to discuss a steroid sparing agent to help manage his symptoms.  Based on immunosuppressive labs were obtained today.  Plan: XR HIPS BILAT W OR W/O PELVIS 3-4 VIEWS, Mutated Citrullinated Vimentin (MCV) Antibody  CRP elevated -CRP elevated-18 on 08/29/24 (prior to steroid use), ESR WNL: Plan to obtain the following lab work today for further evaluation.  Plan: Sedimentation rate, C-reactive protein, CK, Mutated Citrullinated Vimentin (MCV) Antibody  Joint stiffness -X6 months. Responsive to steroid use.  His morning stiffness has been most severe lasting until lunchtime.  In the evenings once he is more sedentary after work his joint stiffness returns.  In the middle the night he has to shuffle while walking to the bathroom due to the pain and stiffness in his hips, knees, and ankles.   Discussed concern for PMR--need to rule out inflammatory arthritis as well. X-rays of both hands and hips updated today.   Plan to update the following lab work today.   Plan: Sedimentation rate, C-reactive protein, CK, Mutated Citrullinated Vimentin (MCV) Antibody  Chronic left shoulder pain: Evaluated at [facility] of the left shoulder from 08/29/2024: Glenohumeral joint is well-maintained.  No significant arthritic changes.  No acute fractures or acute findings.  Subacromial space well-maintained. He has been experiencing increased pain and stiffness in both shoulders x 6 months.  His symptoms have progressively been worsening over the past 1 month.  He has been unable to golf for the last 3 months due to the severity of symptoms.  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  He has been experiencing nocturnal pain in both shoulders, left more  severe than right.  On examination he has discomfort and stiffness with range of motion.  Slightly limited range of motion with full abduction.  Tenderness to palpation of both shoulders noted. Plan to recheck ESR and CRP today.    Chronic right shoulder pain: Patient presents today for further evaluation of pain and stiffness affecting both shoulders.  He has been experiencing nocturnal pain especially when laying on his sides at night.  Concern for PMR versus inflammatory arthritis.  Plan to check the following lab work today.   Chronic pain of left knee - Under care of [facility]-injection on 08/29/24.  Patient had a left knee x-ray on 06/04/2024 which revealed moderate to severe narrowing along the medial joint line.  Moderate patellofemoral changes noted. He is not ready to proceed with a knee replacement at this time.  He has intermittent bouts of warmth and swelling affecting the left knee.  He has had aspirations performed in the past. No effusion was noted on examination today.  High risk medication use - Plan to discuss treatment options at [facility].  Plan to try to use steroid sparing agents due to history of type 2 diabetes with insulin  dependence. Could consider Methotrexate pending results.  Plan to obtain the following baseline immunosuppressive labs today.  Plan: CBC with Differential/Platelet, Comprehensive metabolic panel with GFR, QuantiFERON-TB Gold Plus, Serum protein electrophoresis with  reflex, IgG, IgA, IgM, Hepatitis B core antibody, IgM, Hepatitis B surface antigen, Hepatitis C antibody, HIV Antibody (routine testing w rflx)  Screening for tuberculosis - Plan to update the following lab work today prior to discussing treatment options at the [facility] visit. Plan: CBC with Differential/Platelet, Comprehensive metabolic panel with GFR, QuantiFERON-TB Gold Plus, Serum protein electrophoresis with reflex, IgG, IgA, IgM, Hepatitis B core antibody, IgM, Hepatitis B surface antigen, Hepatitis C  antibody, HIV Antibody (routine testing w rflx)  Other medical conditions are listed as follows:   Retinal microaneurysm of both eyes  PUD (peptic ulcer disease)  Gastroesophageal reflux disease without esophagitis  Colon adenomas  Moderate nonproliferative diabetic retinopathy of both eyes without macular edema associated with type 2 diabetes mellitus (HCC)  Trigger finger of right thumb  Dyslipidemia  Gilbert's syndrome  Nuclear sclerotic cataract of both eyes    Orders: Orders Placed This Encounter  Procedures   XR Hand 2 View Left   XR Hand 2 View Right   XR HIPS BILAT W OR W/O PELVIS 3-4 VIEWS   CBC with Differential/Platelet   Comprehensive metabolic panel with GFR   Sedimentation rate   C-reactive protein   CK   QuantiFERON-TB Gold Plus   Serum protein electrophoresis with reflex   IgG, IgA, IgM   Hepatitis B core antibody, IgM   Hepatitis B surface antigen   Hepatitis C antibody   HIV Antibody (routine testing w rflx)   Mutated Citrullinated Vimentin (MCV) Antibody   No orders of the defined types were placed in this encounter.    Follow-Up Instructions: Return for NPFU.   Waddell CHRISTELLA Craze, PA-C  Note - This record has been created using Dragon software.  Chart creation errors have been sought, but may not always  have been located. Such creation errors do not reflect on  the standard of medical care.

## 2024-10-30 ENCOUNTER — Ambulatory Visit

## 2024-10-30 ENCOUNTER — Ambulatory Visit: Attending: Physician Assistant | Admitting: Physician Assistant

## 2024-10-30 ENCOUNTER — Encounter: Payer: Self-pay | Admitting: Physician Assistant

## 2024-10-30 VITALS — BP 144/81 | HR 76 | Temp 98.4°F | Resp 13 | Ht 70.0 in | Wt 199.6 lb

## 2024-10-30 DIAGNOSIS — M25551 Pain in right hip: Secondary | ICD-10-CM | POA: Diagnosis not present

## 2024-10-30 DIAGNOSIS — M25512 Pain in left shoulder: Secondary | ICD-10-CM

## 2024-10-30 DIAGNOSIS — M79642 Pain in left hand: Secondary | ICD-10-CM

## 2024-10-30 DIAGNOSIS — M25552 Pain in left hip: Secondary | ICD-10-CM

## 2024-10-30 DIAGNOSIS — H2513 Age-related nuclear cataract, bilateral: Secondary | ICD-10-CM

## 2024-10-30 DIAGNOSIS — R7982 Elevated C-reactive protein (CRP): Secondary | ICD-10-CM

## 2024-10-30 DIAGNOSIS — M79641 Pain in right hand: Secondary | ICD-10-CM

## 2024-10-30 DIAGNOSIS — M2550 Pain in unspecified joint: Secondary | ICD-10-CM

## 2024-10-30 DIAGNOSIS — Z79899 Other long term (current) drug therapy: Secondary | ICD-10-CM

## 2024-10-30 DIAGNOSIS — G8929 Other chronic pain: Secondary | ICD-10-CM

## 2024-10-30 DIAGNOSIS — M2560 Stiffness of unspecified joint, not elsewhere classified: Secondary | ICD-10-CM

## 2024-10-30 DIAGNOSIS — K219 Gastro-esophageal reflux disease without esophagitis: Secondary | ICD-10-CM

## 2024-10-30 DIAGNOSIS — M25511 Pain in right shoulder: Secondary | ICD-10-CM

## 2024-10-30 DIAGNOSIS — M25562 Pain in left knee: Secondary | ICD-10-CM

## 2024-10-30 DIAGNOSIS — M19012 Primary osteoarthritis, left shoulder: Secondary | ICD-10-CM

## 2024-10-30 DIAGNOSIS — Z111 Encounter for screening for respiratory tuberculosis: Secondary | ICD-10-CM

## 2024-10-30 DIAGNOSIS — D126 Benign neoplasm of colon, unspecified: Secondary | ICD-10-CM

## 2024-10-30 DIAGNOSIS — E785 Hyperlipidemia, unspecified: Secondary | ICD-10-CM

## 2024-10-30 DIAGNOSIS — H35043 Retinal micro-aneurysms, unspecified, bilateral: Secondary | ICD-10-CM

## 2024-10-30 DIAGNOSIS — K279 Peptic ulcer, site unspecified, unspecified as acute or chronic, without hemorrhage or perforation: Secondary | ICD-10-CM

## 2024-10-30 DIAGNOSIS — E113393 Type 2 diabetes mellitus with moderate nonproliferative diabetic retinopathy without macular edema, bilateral: Secondary | ICD-10-CM

## 2024-10-30 DIAGNOSIS — M65311 Trigger thumb, right thumb: Secondary | ICD-10-CM

## 2024-11-01 ENCOUNTER — Ambulatory Visit: Payer: Self-pay | Admitting: Physician Assistant

## 2024-11-01 NOTE — Progress Notes (Signed)
 Please see if a sooner NPFU visit is available to discuss results and next steps-he still has a few results pending but I do not want him to have to wait until 11/27/24 to be seen. Thank you

## 2024-11-05 NOTE — Progress Notes (Unsigned)
 Office Visit Note  Patient: Louis Joseph             Date of Birth: [birth year]           MRN: [id number]             PCP: Shayne Anes, MD Referring: Shayne Anes, MD Visit Date: 11/06/2024 Occupation: Data Unavailable  Subjective:  Discuss results and treatment options   History of Present Illness: Louis Joseph is a 63 y.o. male with history of inflammatory arthritis.  Patient presents today to discuss x-ray and lab results from the initial office visit.  Patient states that he had a flare starting on Sunday.  Patient states that he was in tears due to severity of pain and stiffness affecting his shoulders, hips, and lower back.  He continues to have morning stiffness lasting until afternoon as well as significant pain and stiffness in the middle of the night.  He also recently had swelling in the left knee. Patient took ibuprofen  4 tablets 2-3 times yesterday for symptomatic relief and his symptoms have started to ease today.  His symptoms remain highly responsive to antiinflammatories.  He denies any response to tylenol .     Activities of Daily Living:  Patient reports morning stiffness for 1-1.5 hours.   Patient Reports nocturnal pain.  Difficulty dressing/grooming: Reports Difficulty climbing stairs: Reports Difficulty getting out of chair: Reports Difficulty using hands for taps, buttons, cutlery, and/or writing: Reports  Review of Systems  Constitutional:  Positive for fatigue.  HENT:  Positive for mouth dryness. Negative for mouth sores.   Eyes:  Negative for dryness.  Respiratory:  Negative for shortness of breath.   Cardiovascular:  Negative for chest pain and palpitations.  Gastrointestinal:  Negative for blood in stool, constipation and diarrhea.  Endocrine: Negative for increased urination.  Genitourinary:  Negative for involuntary urination.  Musculoskeletal:  Positive for joint pain, gait problem, joint pain, joint swelling, myalgias, muscle weakness,  morning stiffness, muscle tenderness and myalgias.  Skin:  Negative for color change, rash, hair loss and sensitivity to sunlight.  Allergic/Immunologic: Positive for susceptible to infections.  Neurological:  Negative for dizziness and headaches.  Hematological:  Negative for swollen glands.  Psychiatric/Behavioral:  Positive for sleep disturbance. Negative for depressed mood. The patient is not nervous/anxious.     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) 11/03/2020   Nuclear sclerotic cataract of both eyes 11/03/2020   Retinal microaneurysm of both eyes 11/03/2020   Trigger finger of right thumb 06/14/2019   Hand arthritis 11/21/2018   Foreign body (FB) in soft tissue 11/21/2018   Metatarsalgia 07/26/2018   Arthritis of left acromioclavicular joint 04/10/2018   Acute colitis 09/08/2015   Fever 09/06/2015   Nausea vomiting and diarrhea 09/06/2015   Headache 09/06/2015   Sepsis (HCC) 09/06/2015   AKI (acute kidney injury) 09/06/2015   Bad headache    Dyslipidemia 03/19/2015   GERD (gastroesophageal reflux disease) 03/19/2015   SOB (shortness of breath) 03/19/2015   DM w/o complication type II (HCC) 03/19/2015   Gilbert's syndrome    Osteopenia    PUD (peptic ulcer disease)    Colon adenomas    Hearing loss    Knee pain 08/14/2013    Past Medical History:  Diagnosis Date   Colon adenomas    Diabetes mellitus without complication (HCC)    DM w/o complication type II (HCC) 03/19/2015  Dyslipidemia 03/19/2015   GERD (gastroesophageal reflux disease) 03/19/2015   Gilbert's syndrome    Hearing loss    s/p hearing aides   Osteopenia    PUD (peptic ulcer disease)     Family History  Problem Relation Age of Onset   Uterine cancer Mother    Heart disease Father    Diabetes Father    Hypertension Father    Hypertension Sister    Leukemia Maternal Grandmother    Heart disease  Paternal Grandmother    Heart disease Paternal Grandfather    Diabetes Other    Past Surgical History:  Procedure Laterality Date   COLONOSCOPY N/A 12/02/2014   Procedure: COLONOSCOPY;  Surgeon: Gladis MARLA Louder, MD;  Location: [facility];  Service: Endoscopy;  Laterality: N/A;   HAND SURGERY     KNEE SURGERY     Social History   Tobacco Use   Smoking status: Former    Current packs/day: 0.50    Types: Cigarettes    Passive exposure: Never   Smokeless tobacco: Never  Vaping Use   Vaping status: Never Used  Substance Use Topics   Alcohol use: No   Drug use: No   Social History   Social History Narrative   Not on file      There is no immunization history on file for this patient.   Objective: Vital Signs: BP (!) 153/71   Pulse 65   Temp 97.8 F (36.6 C)   Resp 17   Ht 5' 10 (1.778 m)   Wt 199 lb 6.4 oz (90.4 kg)   BMI 28.61 kg/m    Physical Exam Vitals and nursing note reviewed.  Constitutional:      Appearance: He is well-developed.  HENT:     Head: Normocephalic and atraumatic.  Eyes:     Conjunctiva/sclera: Conjunctivae normal.     Pupils: Pupils are equal, round, and reactive to light.  Cardiovascular:     Rate and Rhythm: Normal rate and regular rhythm.     Heart sounds: Normal heart sounds.  Pulmonary:     Effort: Pulmonary effort is normal.     Breath sounds: Normal breath sounds.  Abdominal:     General: Bowel sounds are normal.     Palpations: Abdomen is soft.  Musculoskeletal:     Cervical back: Normal range of motion and neck supple.  Skin:    General: Skin is warm and dry.     Capillary Refill: Capillary refill takes less than 2 seconds.  Neurological:     Mental Status: He is alert and oriented to person, place, and time.  Psychiatric:        Behavior: Behavior normal.      Musculoskeletal Exam: C-spine has limited ROM with lateral rotation. Difficulty rising from a seated position without pushing off.  Shoulder joints have  pain and stiffness with ROM.  Difficulty with full abduction of both shoulders. Limited extension of both wrists.  Thickening of bilateral 1st MCPs.  PIP and DIP thickening.  Painful ROM of both hips.  Discomfort with ROM of the left knee.  No effusion noted. Ankle joints have good ROM with no tenderness or joint swelling.   CDAI Exam: CDAI Score: -- Patient Global: --; Provider Global: -- Swollen: --; Tender: -- Joint Exam 11/06/2024   No joint exam has been documented for this visit   There is currently no information documented on the homunculus. Go to the Rheumatology activity and complete the homunculus joint exam.  Investigation: No additional findings.  Imaging: XR HIPS BILAT W OR W/O PELVIS 3-4 VIEWS Result Date: 10/30/2024 No hip joint narrowing was noted.  No SI joint narrowing or sclerosis was noted. Impression: Unremarkable x-rays of the hips.  XR Hand 2 View Left Result Date: 10/30/2024 CMC, PIP and DIP narrowing was noted.  First MCP narrowing and subluxation was noted.  Possible erosion or cyst was noted at the base of the first proximal phalanx.  Cystic versus erosive changes were noted in the carpal bones.  No significant intercarpal radiocarpal joint space narrowing was noted. Impression: This finding suggestive of osteoarthritis and possible inflammatory arthritis.  XR Hand 2 View Right Result Date: 10/30/2024 Severe CMC, PIP and DIP narrowing was noted.  First MCP narrowing was noted.  Likely erosion was noted at the head of the first proximal phalanx.  Cystic versus erosive changes were noted in the carpal bones.  No significant intercarpal radiocarpal joint space narrowing was noted. Impression: These findings are suggestive of osteoarthritis and possible inflammatory arthritis.   Recent Labs: Lab Results  Component Value Date   WBC 7.9 10/30/2024   HGB 14.5 10/30/2024   PLT 347 10/30/2024   NA 137 10/30/2024   K 4.6 10/30/2024   CL 100 10/30/2024   CO2 25  10/30/2024   GLUCOSE 89 10/30/2024   BUN 9 10/30/2024   CREATININE 1.02 10/30/2024   BILITOT 0.6 10/30/2024   ALKPHOS 68 08/16/2022   AST 25 10/30/2024   ALT 25 10/30/2024   PROT 7.0 10/30/2024   PROT 7.1 10/30/2024   ALBUMIN 4.5 08/16/2022   CALCIUM  9.9 10/30/2024   GFRAA >60 09/08/2015   QFTBGOLDPLUS NEGATIVE 10/30/2024    Speciality Comments: No specialty comments available.  Procedures:  No procedures performed Allergies: Codeine   Assessment / Plan:     Visit Diagnoses: Polymyalgia rheumatica: Elevated CRP, elevated WBC count, possible inflammatory arthritis, RF-, MCV-, anti-CCP-, highly responsive to prednisone and ibuprofen : His symptoms initially started 6 months ago but have progressively been worsening over the past 6 weeks.  He has been experiencing pain and stiffness affecting multiple joints but most prominently in both shoulders, both hips, and his lower back.  In melanite he experiences significant pain and stiffness in both hips, both knees, and both ankle joints to the point that he has to shuffle to the bathroom.  His morning stiffness has been lasting into the afternoon. His symptoms have been highly responsive to prednisone in the past.  He had what felt like a flare starting on Sunday and took ibuprofen  3 times daily yesterday which has started to ease his discomfort. Reviewed x-ray and lab results with the patient today in detail.  X-rays of both hands were consistent with osteoarthritis and possible inflammatory arthritis due to cystic vs. erosive changes.  X-rays of both hips were unremarkable. CRP was elevated at 28 on 10/30/2024. Discussed the concern for polymyalgia rheumatica--cannot rule out chronic inflammatory arthritis.  Discussed patients case with Dr. Ernie also came into the room briefly to examine his hands and discuss his symptoms.   Different treatment options were discussed today.  Recommend initiating a steroid sparing agent--reviewed  indications, contraindications, and potential side effects of methotrexate today in detail.  Discussed oral versus injectable methotrexate.  Patient opted for injectable methotrexate--he will be initiating 0.6 mL sq injections once weekly x 2 weeks and if labs are stable he will increase to 0.8 mL sq injections once weekly.  He will take folic acid 2 mg  daily.  He will notify us  if he cannot tolerate taking methotrexate. Plan to reinitiate a prednisone taper to help alleviate his current flare.  He will restart taking prednisone 10 mg daily.  He will follow up in 1 month or sooner if needed.   Chronic inflammatory arthritis - CRP elevated, ESR WNL, RF-, anti-CCP-, MCV-: X-rays of both hands consistent with osteoarthritis and inflammatory arthritis overlap. No synovitis noted today.  Highly responsive to prednisone and ibuprofen .   Difficulty raising arms above his head and rising from a seated position.  Symptoms most severe in both shoulders and both hips concerning for PMR.  He does experience intermittent pain in both hands, left knee, and both ankles.  Limited extension of both wrist noted. Synovial thickening of bilateral 1st and 2nd MCP joints.  Plan to initiate methotrexate and folic acid as discussed above. A prednisone taper will be sent to the pharmacy to alleviate his current symptoms.   Drug Counseling TB Gold: Negative 10/30/24 Hepatitis panel: B and C negative on 10/30/24   Chest-xray:  03/22/24   Alcohol use: Discussed the importance of avoiding alcohol use.   Patient was counseled on the purpose, proper use, and adverse effects of methotrexate including nausea, infection, and signs and symptoms of pneumonitis.  Reviewed instructions with patient to take methotrexate weekly along with folic acid daily.  Discussed the importance of frequent monitoring of kidney and liver function and blood counts, and provided patient with standing lab instructions.  Counseled patient to avoid NSAIDs and  alcohol while on methotrexate.  Provided patient with educational materials on methotrexate and answered all questions.  Advised patient to get annual influenza vaccine and to get a pneumococcal vaccine if patient has not already had one.  Patient voiced understanding.  Patient consented to methotrexate use.  Will upload into chart.    High risk medication use -Plan to initiate methotrexate 0.6 mg sq injections once weekly x 2 weeks and if labs are stable he will increase to 0.8 mL sq injections once weekly.  He will take folic acid 2 mg daily. Baseline immunosuppressive labs obtained 10/30/24.  IFE normal.  CXR 03/22/24.   Hgb A1c followed by Dr. Shayne.  CBC and CMP WNL on 10/30/24.  Plan to update lab work in 2 weeks x 2, 2 months, then every 3 months. No recent or recurrent infections.  Discussed the importance of holding methotrexate if he develops signs or symptoms of an infection and to resume once the infection is completely cleared.  Primary osteoarthritis of both hands - XR consistent with osteoarthritis and possible inflammatory arthritis overlap.  Possible erosive changes noted.  Limited extension of both wrist joints.  Synovial thickening of bilateral 1st and 2nd MCP joints.  PIP and DIP thickening consistent with osteoarthritis of both hands.  Chronic left shoulder pain: Evaluated at [facility] of the left shoulder from 08/29/2024: Glenohumeral joint is well-maintained.  No significant arthritic changes.  No acute fractures or acute findings.  Subacromial space well-maintained.   Chronic right shoulder pain: Chronic pain and stiffness.  CRP elevated. Difficulty raising arms above his head due to the severity of pain or stiffness.    Bilateral hip pain - XR unremarkable 10/30/24: Suspicious for PMR, CRP elevated 28.  He continues to have difficulty rising from a seated position and has to shuffle while walking in the middle of the night.   plan to reinitiate a prednisone taper as  discussed above.  Plan to also initiate methotrexate and folic  acid as discussed above.  Chronic pain of left knee: - Under care of [facility]-injection on 08/29/24.  Patient had a left knee x-ray on 06/04/2024 which revealed moderate to severe narrowing along the medial joint line.  Moderate patellofemoral changes noted.  Intermittent pain and swelling.  Significant stiffness in the morning and at night.   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    Dispense:  180 tablet    Refill:  2   methotrexate 50 MG/2ML injection    Sig: Inject 0.6mL subcut once weekly x 2 weeks. REPEAT LABS. If stable, increase to 0.8mL subcut once weekly x 2 weeks. Repeat labs    Dispense:  10 mL    Refill:  0    Follow-Up Instructions: Return in about 4 weeks (around 12/04/2024).   Louis CHRISTELLA Craze, PA-C  Note - This record has been created using Dragon software.  Chart creation errors have been sought, but may not always  have been located. Such creation errors do not reflect on  the standard of medical care.

## 2024-11-06 ENCOUNTER — Encounter: Payer: Self-pay | Admitting: Physician Assistant

## 2024-11-06 ENCOUNTER — Ambulatory Visit: Attending: Physician Assistant | Admitting: Physician Assistant

## 2024-11-06 VITALS — BP 153/71 | HR 65 | Temp 97.8°F | Resp 17 | Ht 70.0 in | Wt 199.4 lb

## 2024-11-06 DIAGNOSIS — K279 Peptic ulcer, site unspecified, unspecified as acute or chronic, without hemorrhage or perforation: Secondary | ICD-10-CM

## 2024-11-06 DIAGNOSIS — M25511 Pain in right shoulder: Secondary | ICD-10-CM | POA: Diagnosis not present

## 2024-11-06 DIAGNOSIS — M19012 Primary osteoarthritis, left shoulder: Secondary | ICD-10-CM

## 2024-11-06 DIAGNOSIS — Z79899 Other long term (current) drug therapy: Secondary | ICD-10-CM | POA: Diagnosis not present

## 2024-11-06 DIAGNOSIS — M25551 Pain in right hip: Secondary | ICD-10-CM

## 2024-11-06 DIAGNOSIS — M25512 Pain in left shoulder: Secondary | ICD-10-CM | POA: Diagnosis not present

## 2024-11-06 DIAGNOSIS — G8929 Other chronic pain: Secondary | ICD-10-CM

## 2024-11-06 DIAGNOSIS — E785 Hyperlipidemia, unspecified: Secondary | ICD-10-CM

## 2024-11-06 DIAGNOSIS — H2513 Age-related nuclear cataract, bilateral: Secondary | ICD-10-CM

## 2024-11-06 DIAGNOSIS — M25552 Pain in left hip: Secondary | ICD-10-CM

## 2024-11-06 DIAGNOSIS — K219 Gastro-esophageal reflux disease without esophagitis: Secondary | ICD-10-CM

## 2024-11-06 DIAGNOSIS — M25562 Pain in left knee: Secondary | ICD-10-CM

## 2024-11-06 DIAGNOSIS — M19041 Primary osteoarthritis, right hand: Secondary | ICD-10-CM

## 2024-11-06 DIAGNOSIS — M353 Polymyalgia rheumatica: Secondary | ICD-10-CM

## 2024-11-06 DIAGNOSIS — H35043 Retinal micro-aneurysms, unspecified, bilateral: Secondary | ICD-10-CM

## 2024-11-06 DIAGNOSIS — M65311 Trigger thumb, right thumb: Secondary | ICD-10-CM

## 2024-11-06 DIAGNOSIS — M1380 Other specified arthritis, unspecified site: Secondary | ICD-10-CM

## 2024-11-06 DIAGNOSIS — D126 Benign neoplasm of colon, unspecified: Secondary | ICD-10-CM

## 2024-11-06 DIAGNOSIS — E113393 Type 2 diabetes mellitus with moderate nonproliferative diabetic retinopathy without macular edema, bilateral: Secondary | ICD-10-CM

## 2024-11-06 LAB — QUANTIFERON-TB GOLD PLUS
Mitogen-NIL: 6.42 [IU]/mL
NIL: 0.01 [IU]/mL
QuantiFERON-TB Gold Plus: NEGATIVE
TB1-NIL: 0 [IU]/mL
TB2-NIL: 0 [IU]/mL

## 2024-11-06 LAB — CBC WITH DIFFERENTIAL/PLATELET
Absolute Lymphocytes: 1454 {cells}/uL (ref 850–3900)
Absolute Monocytes: 411 {cells}/uL (ref 200–950)
Basophils Absolute: 40 {cells}/uL (ref 0–200)
Basophils Relative: 0.5 %
Eosinophils Absolute: 79 {cells}/uL (ref 15–500)
Eosinophils Relative: 1 %
HCT: 43.6 % (ref 38.5–50.0)
Hemoglobin: 14.5 g/dL (ref 13.2–17.1)
MCH: 29.9 pg (ref 27.0–33.0)
MCHC: 33.3 g/dL (ref 32.0–36.0)
MCV: 89.9 fL (ref 80.0–100.0)
MPV: 10.8 fL (ref 7.5–12.5)
Monocytes Relative: 5.2 %
Neutro Abs: 5917 {cells}/uL (ref 1500–7800)
Neutrophils Relative %: 74.9 %
Platelets: 347 Thousand/uL (ref 140–400)
RBC: 4.85 Million/uL (ref 4.20–5.80)
RDW: 12.6 % (ref 11.0–15.0)
Total Lymphocyte: 18.4 %
WBC: 7.9 Thousand/uL (ref 3.8–10.8)

## 2024-11-06 LAB — HIV ANTIBODY (ROUTINE TESTING W REFLEX)
HIV 1&2 Ab, 4th Generation: NONREACTIVE
HIV FINAL INTERPRETATION: NEGATIVE

## 2024-11-06 LAB — PROTEIN ELECTROPHORESIS, SERUM, WITH REFLEX
Albumin ELP: 4.4 g/dL (ref 3.8–4.8)
Alpha 1: 0.4 g/dL — ABNORMAL HIGH (ref 0.2–0.3)
Alpha 2: 0.9 g/dL (ref 0.5–0.9)
Beta 2: 0.4 g/dL (ref 0.2–0.5)
Beta Globulin: 0.5 g/dL (ref 0.4–0.6)
Gamma Globulin: 0.6 g/dL — ABNORMAL LOW (ref 0.8–1.7)
Total Protein: 7.1 g/dL (ref 6.1–8.1)

## 2024-11-06 LAB — HEPATITIS B SURFACE ANTIGEN: Hepatitis B Surface Ag: NONREACTIVE

## 2024-11-06 LAB — IFE INTERPRETATION

## 2024-11-06 LAB — COMPREHENSIVE METABOLIC PANEL WITH GFR
AG Ratio: 1.8 (calc) (ref 1.0–2.5)
ALT: 25 U/L (ref 9–46)
AST: 25 U/L (ref 10–35)
Albumin: 4.5 g/dL (ref 3.6–5.1)
Alkaline phosphatase (APISO): 83 U/L (ref 35–144)
BUN: 9 mg/dL (ref 7–25)
CO2: 25 mmol/L (ref 20–32)
Calcium: 9.9 mg/dL (ref 8.6–10.3)
Chloride: 100 mmol/L (ref 98–110)
Creat: 1.02 mg/dL (ref 0.70–1.35)
Globulin: 2.5 g/dL (ref 1.9–3.7)
Glucose, Bld: 89 mg/dL (ref 65–99)
Potassium: 4.6 mmol/L (ref 3.5–5.3)
Sodium: 137 mmol/L (ref 135–146)
Total Bilirubin: 0.6 mg/dL (ref 0.2–1.2)
Total Protein: 7 g/dL (ref 6.1–8.1)
eGFR: 82 mL/min/1.73m2 (ref >=60)

## 2024-11-06 LAB — IGG, IGA, IGM
IgG (Immunoglobin G), Serum: 606 mg/dL (ref 600–1540)
IgM, Serum: 97 mg/dL (ref 50–300)
Immunoglobulin A: 208 mg/dL (ref 70–320)

## 2024-11-06 LAB — HEPATITIS B CORE ANTIBODY, IGM: Hep B C IgM: NONREACTIVE

## 2024-11-06 LAB — CK: Total CK: 128 U/L (ref 22–308)

## 2024-11-06 LAB — MUTATED CITRULLINATED VIMENTIN (MCV) ANTIBODY: MUTATED CITRULLINATED VIMENTIN (MCV) AB: <20 U/mL (ref <20)

## 2024-11-06 LAB — HEPATITIS C ANTIBODY: Hepatitis C Ab: NONREACTIVE

## 2024-11-06 LAB — C-REACTIVE PROTEIN: CRP: 28 mg/L — ABNORMAL HIGH (ref <8.0)

## 2024-11-06 LAB — SEDIMENTATION RATE: Sed Rate: 17 mm/h (ref 0–20)

## 2024-11-06 MED ORDER — PREDNISONE 10 MG PO TABS: 10.0000 mg | ORAL_TABLET | Freq: Every day | ORAL | 0 refills | Status: DC

## 2024-11-06 MED ORDER — METHOTREXATE SODIUM CHEMO INJECTION 50 MG/2ML: INTRAMUSCULAR | 0 refills | Status: DC

## 2024-11-06 MED ORDER — FOLIC ACID 1 MG PO TABS: 2.0000 mg | ORAL_TABLET | Freq: Every day | ORAL | 2 refills | Status: AC

## 2024-11-06 NOTE — Progress Notes (Signed)
 Pharmacy Note  Subjective: Patient presents today to [facility] for follow up office visit. Patient seen by the pharmacist for counseling on methotrexate for inflammatory arthritis. Prior therapy includes: ibuprofen .  Objective: CBC    Component Value Date/Time   WBC 7.9 10/30/2024 1156   RBC 4.85 10/30/2024 1156   HGB 14.5 10/30/2024 1156   HCT 43.6 10/30/2024 1156   PLT 347 10/30/2024 1156   MCV 89.9 10/30/2024 1156   MCH 29.9 10/30/2024 1156   MCHC 33.3 10/30/2024 1156   RDW 12.6 10/30/2024 1156   LYMPHSABS 1.6 08/16/2022 1312   MONOABS 0.5 08/16/2022 1312   EOSABS 79 10/30/2024 1156   BASOSABS 40 10/30/2024 1156    CMP     Component Value Date/Time   NA 137 10/30/2024 1156   K 4.6 10/30/2024 1156   CL 100 10/30/2024 1156   CO2 25 10/30/2024 1156   GLUCOSE 89 10/30/2024 1156   BUN 9 10/30/2024 1156   CREATININE 1.02 10/30/2024 1156   CALCIUM  9.9 10/30/2024 1156   PROT 7.0 10/30/2024 1156   PROT 7.1 10/30/2024 1156   ALBUMIN 4.5 08/16/2022 1312   ALBUMIN 3,761 09/05/2015 2321   AST 25 10/30/2024 1156   ALT 25 10/30/2024 1156   ALKPHOS 68 08/16/2022 1312   BILITOT 0.6 10/30/2024 1156   GFRNONAA >60 08/16/2022 1312   GFRAA >60 09/08/2015 0501    Baseline Immunosuppressant Therapy Labs TB GOLD    Latest Ref Rng & Units 10/30/2024   11:56 AM  Quantiferon TB Gold  Quantiferon TB Gold Plus NEGATIVE NEGATIVE    Hepatitis Panel    Latest Ref Rng & Units 10/30/2024   11:56 AM  Hepatitis  Hep B Surface Ag NON-REACTIVE NON-REACTIVE   Hep B IgM NON-REACTIVE NON-REACTIVE   Hep C Ab NON-REACTIVE NON-REACTIVE    HIV Lab Results  Component Value Date   HIV NON-REACTIVE 10/30/2024   HIV Non Reactive 09/06/2015   Immunoglobulins    Latest Ref Rng & Units 10/30/2024   11:56 AM  Immunoglobulin Electrophoresis  IgA  70 - 320 mg/dL 791   IgG 399 - 8,459 mg/dL 393   IgM 50 - 699 mg/dL 97    SPEP    Latest Ref Rng & Units 10/30/2024   11:56 AM   Serum Protein Electrophoresis  Total Protein 6.1 - 8.1 g/dL 6.1 - 8.1 g/dL 7.0    7.1   Albumin 3.8 - 4.8 g/dL 4.4   Alpha-1 0.2 - 0.3 g/dL 0.4   Alpha-2 0.5 - 0.9 g/dL 0.9   Beta Globulin 0.4 - 0.6 g/dL 0.5   Beta 2 0.2 - 0.5 g/dL 0.4   Gamma Globulin 0.8 - 1.7 g/dL 0.6   Interpretation  --    G6PD No results found for: G6PDH TPMT No results found for: TPMT   Chest-xray: 03/22/2024: IMPRESSION: Hypoinflation of the lungs with minimal bibasilar subsegmental atelectasis.  Contraception: N/A  Alcohol use: rare occasions, typically will have a beer if he is out eating wings  Assessment/Plan:   Patient was counseled on the purpose, proper use, and adverse effects of methotrexate including nausea, infection, and signs and symptoms of pneumonitis. Discussed that there is the possibility of an increased risk of malignancy, specifically lymphomas, but it is not well understood if this increased risk is due to the medication or the disease state.  Instructed patient that medication should be held for infection and prior to surgery.  Advised patient to avoid live vaccines. Recommend annual  influenza, Pneumovax 23, Prevnar 13, and Shingrix as indicated.   Reviewed instructions with patient to take methotrexate weekly along with folic acid daily.  Discussed the importance of frequent monitoring of kidney and liver function and blood counts, and provided patient with standing lab instructions.  Counseled patient to avoid NSAIDs and alcohol while on methotrexate.  Provided patient with educational materials on methotrexate and answered all questions.   Patient voiced understanding.  Patient consented to methotrexate use.  Will upload into chart.    Educated patient on how to use a vial and syringe and reviewed injection technique with patient.  Patient was able to demonstrate proper technique for injections using vial and syringe.  Provided patient educational material regarding injection  technique and storage of methotrexate.     Dose of methotrexate will be 0.6 mL subcut for 2 weeks. Repeat CBC/CMP. If stable, increase to 0.8 mL weekly along with folic acid 2 mg daily.  Repeat CBC/CMP in 2 weeks then every 3 months.  Jenkins Graces, PharmD PGY1 Pharmacy Resident [phone]

## 2024-11-06 NOTE — Patient Instructions (Signed)
 Standing Labs We placed an order today for your standing lab work.   Please have your standing labs drawn in 2 weeks x2, 2 months, then every 3 months   Please have your labs drawn 2 weeks prior to your appointment so that the provider can discuss your lab results at your appointment, if possible.  Please note that you may see your imaging and lab results in MyChart before we have reviewed them. We will contact you once all results are reviewed. Please allow our office up to 72 hours to thoroughly review all of the results before contacting the office for clarification of your results.  WALK-IN LAB HOURS  Monday through Thursday from 8:00 am -12:30 pm and 1:00 pm-4:30 pm and Friday from 8:00 am-12:00 pm.  Patients with office visits requiring labs will be seen before walk-in labs.  You may encounter longer than normal wait times. Please allow additional time. Wait times may be shorter on  Monday and Thursday afternoons.  We do not book appointments for walk-in labs. We appreciate your patience and understanding with our staff.   Labs are drawn by [facility]. Please bring your co-pay at the time of your lab draw.  You may receive a bill from [facility] for your lab work.  Please note if you are on Hydroxychloroquine and and an order has been placed for a Hydroxychloroquine level,  you will need to have it drawn 4 hours or more after your last dose.  If you wish to have your labs drawn at another location, please call the office 24 hours in advance so we can fax the orders.  The office is located at [address], [address], [place], [place] [address]   If you have any questions regarding directions or hours of operation,  please call [phone].   As a reminder, please drink plenty of water prior to coming for your lab work. Thanks!

## 2024-11-06 NOTE — Addendum Note (Signed)
 Addended by: CHERYL WADDELL HERO on: 11/06/2024 04:31 PM   Modules accepted: Orders

## 2024-11-13 NOTE — Progress Notes (Deleted)
 Office Visit Note  Patient: Louis Joseph             Date of Birth: 06-Jun-1961           MRN: [id number]             PCP: Shayne Anes, MD Referring: Shayne Anes, MD Visit Date: 11/27/2024 Occupation: Data Unavailable  Subjective:  No chief complaint on file.   History of Present Illness: Louis Joseph is a 63 y.o. male ***     Activities of Daily Living:  Patient reports morning stiffness for *** {minute/hour:19697}.   Patient {ACTIONS;DENIES/REPORTS:21021675::Denies} nocturnal pain.  Difficulty dressing/grooming: {ACTIONS;DENIES/REPORTS:21021675::Denies} Difficulty climbing stairs: {ACTIONS;DENIES/REPORTS:21021675::Denies} Difficulty getting out of chair: {ACTIONS;DENIES/REPORTS:21021675::Denies} Difficulty using hands for taps, buttons, cutlery, and/or writing: {ACTIONS;DENIES/REPORTS:21021675::Denies}  No Rheumatology ROS completed.   PMFS History:  Patient Active Problem List   Diagnosis Date Noted  . 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 Nuclear sclerotic cataract of both eyes 11/03/2020  . 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 Trigger finger of right thumb 06/14/2019  . 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 Foreign body (FB) in soft tissue 11/21/2018  . Metatarsalgia 07/26/2018  . Arthritis of left acromioclavicular joint 04/10/2018  . Acute colitis 09/08/2015  . Fever 09/06/2015  . Nausea vomiting and diarrhea 09/06/2015  . Headache 09/06/2015  . Sepsis (HCC) 09/06/2015  . AKI (acute kidney injury) 09/06/2015  . Bad headache   . Dyslipidemia 03/19/2015  . GERD (gastroesophageal reflux disease) 03/19/2015  . SOB (shortness of breath) 03/19/2015  . DM w/o complication type II (HCC) 03/19/2015  . Gilbert's syndrome   . Osteopenia   . PUD (peptic ulcer disease)   . Colon adenomas   . Hearing loss   . Knee pain 08/14/2013    Past Medical History:  Diagnosis Date  . Colon adenomas   .  Diabetes mellitus without complication (HCC)   . DM w/o complication type II (HCC) 03/19/2015  . Dyslipidemia 03/19/2015  . GERD (gastroesophageal reflux disease) 03/19/2015  . Gilbert's syndrome   . Hearing loss    s/p hearing aides  . Osteopenia   . PUD (peptic ulcer disease)     Family History  Problem Relation Age of Onset  . Uterine cancer Mother   . Heart disease Father   . Diabetes Father   . Hypertension Father   . Hypertension Sister   . Leukemia Maternal Grandmother   . Heart disease Paternal Grandmother   . Heart disease Paternal Grandfather   . Diabetes Other    Past Surgical History:  Procedure Laterality Date  . COLONOSCOPY N/A 12/02/2014   Procedure: COLONOSCOPY;  Surgeon: Gladis MARLA Louder, MD;  Location: [facility];  Service: Endoscopy;  Laterality: N/A;  . HAND SURGERY    . KNEE SURGERY     Social History   Tobacco Use  . Smoking status: Former    Current packs/day: 0.50    Types: Cigarettes    Passive exposure: Never  . Smokeless tobacco: Never  Vaping Use  . Vaping status: Never Used  Substance Use Topics  . Alcohol use: No  . Drug use: No   Social History   Social History Narrative  . Not on file      There is no immunization history on file for this patient.   Objective: Vital Signs: There were no vitals taken for this visit.   Physical Exam Vitals and nursing note reviewed.  Constitutional:  Appearance: He is well-developed.  HENT:     Head: Normocephalic and atraumatic.  Eyes:     Conjunctiva/sclera: Conjunctivae normal.     Pupils: Pupils are equal, round, and reactive to light.  Cardiovascular:     Rate and Rhythm: Normal rate and regular rhythm.     Heart sounds: Normal heart sounds.  Pulmonary:     Effort: Pulmonary effort is normal.     Breath sounds: Normal breath sounds.  Abdominal:     General: Bowel sounds are normal.     Palpations: Abdomen is soft.  Musculoskeletal:     Cervical back: Normal range of  motion and neck supple.  Skin:    General: Skin is warm and dry.     Capillary Refill: Capillary refill takes less than 2 seconds.  Neurological:     Mental Status: He is alert and oriented to person, place, and time.  Psychiatric:        Behavior: Behavior normal.     Musculoskeletal Exam: ***  CDAI Exam: CDAI Score: -- Patient Global: --; Provider Global: -- Swollen: --; Tender: -- Joint Exam 11/27/2024   No joint exam has been documented for this visit   There is currently no information documented on the homunculus. Go to the Rheumatology activity and complete the homunculus joint exam.  Investigation: No additional findings.  Imaging: XR HIPS BILAT W OR W/O PELVIS 3-4 VIEWS Result Date: 10/30/2024 No hip joint narrowing was noted.  No SI joint narrowing or sclerosis was noted. Impression: Unremarkable x-rays of the hips.  XR Hand 2 View Left Result Date: 10/30/2024 CMC, PIP and DIP narrowing was noted.  First MCP narrowing and subluxation was noted.  Possible erosion or cyst was noted at the base of the first proximal phalanx.  Cystic versus erosive changes were noted in the carpal bones.  No significant intercarpal radiocarpal joint space narrowing was noted. Impression: This finding suggestive of osteoarthritis and possible inflammatory arthritis.  XR Hand 2 View Right Result Date: 10/30/2024 Severe CMC, PIP and DIP narrowing was noted.  First MCP narrowing was noted.  Likely erosion was noted at the head of the first proximal phalanx.  Cystic versus erosive changes were noted in the carpal bones.  No significant intercarpal radiocarpal joint space narrowing was noted. Impression: These findings are suggestive of osteoarthritis and possible inflammatory arthritis.   Recent Labs: Lab Results  Component Value Date   WBC 7.9 10/30/2024   HGB 14.5 10/30/2024   PLT 347 10/30/2024   NA 137 10/30/2024   K 4.6 10/30/2024   CL 100 10/30/2024   CO2 25 10/30/2024    GLUCOSE 89 10/30/2024   BUN 9 10/30/2024   CREATININE 1.02 10/30/2024   BILITOT 0.6 10/30/2024   ALKPHOS 68 08/16/2022   AST 25 10/30/2024   ALT 25 10/30/2024   PROT 7.0 10/30/2024   PROT 7.1 10/30/2024   ALBUMIN 4.5 08/16/2022   CALCIUM  9.9 10/30/2024   GFRAA >60 09/08/2015   QFTBGOLDPLUS NEGATIVE 10/30/2024    Speciality Comments: No specialty comments available.  Procedures:  No procedures performed Allergies: Codeine   Assessment / Plan:     Visit Diagnoses: No diagnosis found.  Orders: No orders of the defined types were placed in this encounter.  No orders of the defined types were placed in this encounter.   Face-to-face time spent with patient was *** minutes. Greater than 50% of time was spent in counseling and coordination of care.  Follow-Up Instructions: No follow-ups  on file.   Waddell CHRISTELLA Craze, PA-C  Note - This record has been created using Dragon software.  Chart creation errors have been sought, but may not always  have been located. Such creation errors do not reflect on  the standard of medical care.

## 2024-11-20 ENCOUNTER — Other Ambulatory Visit: Payer: Self-pay

## 2024-11-20 DIAGNOSIS — Z79899 Other long term (current) drug therapy: Secondary | ICD-10-CM

## 2024-11-20 DIAGNOSIS — M1380 Other specified arthritis, unspecified site: Secondary | ICD-10-CM

## 2024-11-21 ENCOUNTER — Ambulatory Visit: Payer: Self-pay | Admitting: Physician Assistant

## 2024-11-21 LAB — CBC WITH DIFFERENTIAL/PLATELET
Absolute Lymphocytes: 991 {cells}/uL (ref 850–3900)
Absolute Monocytes: 295 {cells}/uL (ref 200–950)
Basophils Absolute: 47 {cells}/uL (ref 0–200)
Basophils Relative: 0.4 %
Eosinophils Absolute: 12 {cells}/uL — ABNORMAL LOW (ref 15–500)
Eosinophils Relative: 0.1 %
HCT: 41.1 % (ref 39.4–51.1)
Hemoglobin: 13.8 g/dL (ref 13.2–17.1)
MCH: 30.5 pg (ref 27.0–33.0)
MCHC: 33.6 g/dL (ref 31.6–35.4)
MCV: 90.9 fL (ref 81.4–101.7)
MPV: 11 fL (ref 7.5–12.5)
Monocytes Relative: 2.5 %
Neutro Abs: 10455 {cells}/uL — ABNORMAL HIGH (ref 1500–7800)
Neutrophils Relative %: 88.6 %
Platelets: 305 Thousand/uL (ref 140–400)
RBC: 4.52 Million/uL (ref 4.20–5.80)
RDW: 12.7 % (ref 11.0–15.0)
Total Lymphocyte: 8.4 %
WBC: 11.8 Thousand/uL — ABNORMAL HIGH (ref 3.8–10.8)

## 2024-11-21 LAB — COMPREHENSIVE METABOLIC PANEL WITH GFR
AG Ratio: 2.3 (calc) (ref 1.0–2.5)
ALT: 19 U/L (ref 9–46)
AST: 19 U/L (ref 10–35)
Albumin: 4.6 g/dL (ref 3.6–5.1)
Alkaline phosphatase (APISO): 76 U/L (ref 35–144)
BUN: 15 mg/dL (ref 7–25)
CO2: 26 mmol/L (ref 20–32)
Calcium: 9.4 mg/dL (ref 8.6–10.3)
Chloride: 98 mmol/L (ref 98–110)
Creat: 1.17 mg/dL (ref 0.70–1.35)
Globulin: 2 g/dL (ref 1.9–3.7)
Glucose, Bld: 253 mg/dL — ABNORMAL HIGH (ref 65–99)
Potassium: 5.1 mmol/L (ref 3.5–5.3)
Sodium: 135 mmol/L (ref 135–146)
Total Bilirubin: 0.6 mg/dL (ref 0.2–1.2)
Total Protein: 6.6 g/dL (ref 6.1–8.1)
eGFR: 70 mL/min/1.73m2 (ref >=60)

## 2024-11-21 NOTE — Progress Notes (Signed)
 Ok to increase dose of methotrexate 

## 2024-11-21 NOTE — Progress Notes (Signed)
 Glucose is elevated at 253, rest of CMP within normal limits. White blood cell count is elevated at 11.8, absolute neutrophils are elevated.  May be related to prednisone  use. Please clarify the dose of methotrexate 

## 2024-11-27 ENCOUNTER — Ambulatory Visit: Admitting: Physician Assistant

## 2024-11-27 DIAGNOSIS — H35043 Retinal micro-aneurysms, unspecified, bilateral: Secondary | ICD-10-CM

## 2024-11-27 DIAGNOSIS — Z79899 Other long term (current) drug therapy: Secondary | ICD-10-CM

## 2024-11-27 DIAGNOSIS — M19041 Primary osteoarthritis, right hand: Secondary | ICD-10-CM

## 2024-11-27 DIAGNOSIS — E113393 Type 2 diabetes mellitus with moderate nonproliferative diabetic retinopathy without macular edema, bilateral: Secondary | ICD-10-CM

## 2024-11-27 DIAGNOSIS — G8929 Other chronic pain: Secondary | ICD-10-CM

## 2024-11-27 DIAGNOSIS — E785 Hyperlipidemia, unspecified: Secondary | ICD-10-CM

## 2024-11-27 DIAGNOSIS — M1380 Other specified arthritis, unspecified site: Secondary | ICD-10-CM

## 2024-11-27 DIAGNOSIS — M19012 Primary osteoarthritis, left shoulder: Secondary | ICD-10-CM

## 2024-11-27 DIAGNOSIS — K219 Gastro-esophageal reflux disease without esophagitis: Secondary | ICD-10-CM

## 2024-11-27 DIAGNOSIS — M25551 Pain in right hip: Secondary | ICD-10-CM

## 2024-11-27 DIAGNOSIS — M353 Polymyalgia rheumatica: Secondary | ICD-10-CM

## 2024-11-27 DIAGNOSIS — M65311 Trigger thumb, right thumb: Secondary | ICD-10-CM

## 2024-11-27 DIAGNOSIS — K279 Peptic ulcer, site unspecified, unspecified as acute or chronic, without hemorrhage or perforation: Secondary | ICD-10-CM

## 2024-11-27 DIAGNOSIS — D126 Benign neoplasm of colon, unspecified: Secondary | ICD-10-CM

## 2024-11-27 DIAGNOSIS — H2513 Age-related nuclear cataract, bilateral: Secondary | ICD-10-CM

## 2024-11-29 ENCOUNTER — Ambulatory Visit: Attending: Physician Assistant | Admitting: Physician Assistant

## 2024-11-29 ENCOUNTER — Encounter: Payer: Self-pay | Admitting: Physician Assistant

## 2024-11-29 VITALS — BP 152/78 | HR 67 | Temp 97.8°F | Resp 15 | Ht 70.0 in | Wt 200.8 lb

## 2024-11-29 DIAGNOSIS — M353 Polymyalgia rheumatica: Secondary | ICD-10-CM

## 2024-11-29 DIAGNOSIS — M19012 Primary osteoarthritis, left shoulder: Secondary | ICD-10-CM

## 2024-11-29 DIAGNOSIS — E785 Hyperlipidemia, unspecified: Secondary | ICD-10-CM

## 2024-11-29 DIAGNOSIS — Z79899 Other long term (current) drug therapy: Secondary | ICD-10-CM

## 2024-11-29 DIAGNOSIS — H2513 Age-related nuclear cataract, bilateral: Secondary | ICD-10-CM

## 2024-11-29 DIAGNOSIS — M25551 Pain in right hip: Secondary | ICD-10-CM

## 2024-11-29 DIAGNOSIS — M25511 Pain in right shoulder: Secondary | ICD-10-CM

## 2024-11-29 DIAGNOSIS — G8929 Other chronic pain: Secondary | ICD-10-CM

## 2024-11-29 DIAGNOSIS — D126 Benign neoplasm of colon, unspecified: Secondary | ICD-10-CM

## 2024-11-29 DIAGNOSIS — E113393 Type 2 diabetes mellitus with moderate nonproliferative diabetic retinopathy without macular edema, bilateral: Secondary | ICD-10-CM

## 2024-11-29 DIAGNOSIS — M25552 Pain in left hip: Secondary | ICD-10-CM

## 2024-11-29 DIAGNOSIS — M65311 Trigger thumb, right thumb: Secondary | ICD-10-CM

## 2024-11-29 DIAGNOSIS — K219 Gastro-esophageal reflux disease without esophagitis: Secondary | ICD-10-CM

## 2024-11-29 DIAGNOSIS — H35043 Retinal micro-aneurysms, unspecified, bilateral: Secondary | ICD-10-CM

## 2024-11-29 DIAGNOSIS — M19041 Primary osteoarthritis, right hand: Secondary | ICD-10-CM

## 2024-11-29 DIAGNOSIS — M1380 Other specified arthritis, unspecified site: Secondary | ICD-10-CM

## 2024-11-29 DIAGNOSIS — M25562 Pain in left knee: Secondary | ICD-10-CM

## 2024-11-29 DIAGNOSIS — K279 Peptic ulcer, site unspecified, unspecified as acute or chronic, without hemorrhage or perforation: Secondary | ICD-10-CM

## 2024-11-29 NOTE — Progress Notes (Signed)
 Office Visit Note  Patient: Louis Joseph             Date of Birth: 02-20-1961           MRN: [id number]             PCP: Shayne Anes, MD Referring: Shayne Anes, MD Visit Date: 11/29/2024 Occupation: Data Unavailable  Subjective:  Medication monitoring   History of Present Illness: WINDLE HUEBERT is a 63 y.o. male with history of polymyalgia rheuamtica and chronic inflammatory arthritis.  Patient is currently on methotrexate  0.8 ml sq injections once weekly and folic acid  2 mg daily.  He was added on methotrexate  0.6 mL or subcu injections once weekly x 2 weeks and then increase to 0.8 mL x 1 dose.  He is planning to administer the fourth dose of methotrexate  tomorrow.  Patient states that the last 2 doses he has noticed some mild nausea about 24 hours after administering methotrexate .  Patient states that the nausea has been tolerable and is not to the point that he would like to reduce the dose or discontinue methotrexate .  He remains on prednisone  10 mg by mouth daily.  Patient states that he has noticed a 60% improvement in the pain and stiffness affecting his shoulders and hips on the current combination.  His morning stiffness has only been lasting for 2 to 3 hours instead of until the middle of the afternoon. Patient is planning to receive the RSV vaccine and Shingrix vaccine as recommended by his PCP.   Activities of Daily Living:  Patient reports morning stiffness for 2-3 hours.   Patient Reports nocturnal pain.  Difficulty dressing/grooming: Reports Difficulty climbing stairs: Reports Difficulty getting out of chair: Reports Difficulty using hands for taps, buttons, cutlery, and/or writing: Reports  Review of Systems  Constitutional:  Positive for fatigue.  HENT:  Positive for mouth dryness. Negative for mouth sores.   Eyes:  Negative for dryness.  Respiratory:  Negative for shortness of breath.   Cardiovascular:  Negative for chest pain and palpitations.   Gastrointestinal:  Positive for diarrhea and nausea. Negative for blood in stool and constipation.  Endocrine: Negative for increased urination.  Genitourinary:  Negative for involuntary urination.  Musculoskeletal:  Positive for joint pain, gait problem, joint pain, myalgias, muscle weakness, morning stiffness, muscle tenderness and myalgias. Negative for joint swelling.  Skin:  Negative for color change, rash, hair loss and sensitivity to sunlight.  Allergic/Immunologic: Negative for susceptible to infections.  Neurological:  Negative for dizziness and headaches.  Hematological:  Negative for swollen glands.  Psychiatric/Behavioral:  Negative for depressed mood and sleep disturbance. The patient is not nervous/anxious.     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) 11/03/2020   Nuclear sclerotic cataract of both eyes 11/03/2020   Retinal microaneurysm of both eyes 11/03/2020   Trigger finger of right thumb 06/14/2019   Hand arthritis 11/21/2018   Foreign body (FB) in soft tissue 11/21/2018   Metatarsalgia 07/26/2018   Arthritis of left acromioclavicular joint 04/10/2018   Acute colitis 09/08/2015   Fever 09/06/2015   Nausea vomiting and diarrhea 09/06/2015   Headache 09/06/2015   Sepsis (HCC) 09/06/2015   AKI (acute kidney injury) 09/06/2015   Bad headache    Dyslipidemia 03/19/2015   GERD (gastroesophageal reflux disease) 03/19/2015   SOB (shortness of breath) 03/19/2015   DM w/o complication type II (HCC) 03/19/2015  Gilbert's syndrome    Osteopenia    PUD (peptic ulcer disease)    Colon adenomas    Hearing loss    Knee pain 08/14/2013    Past Medical History:  Diagnosis Date   Colon adenomas    Diabetes mellitus without complication (HCC)    DM w/o complication type II (HCC) 03/19/2015   Dyslipidemia 03/19/2015   GERD (gastroesophageal reflux disease) 03/19/2015   Gilbert's  syndrome    Hearing loss    s/p hearing aides   Osteopenia    PUD (peptic ulcer disease)     Family History  Problem Relation Age of Onset   Uterine cancer Mother    Heart disease Father    Diabetes Father    Hypertension Father    Hypertension Sister    Leukemia Maternal Grandmother    Heart disease Paternal Grandmother    Heart disease Paternal Grandfather    Diabetes Other    Past Surgical History:  Procedure Laterality Date   COLONOSCOPY N/A 12/02/2014   Procedure: COLONOSCOPY;  Surgeon: Gladis MARLA Louder, MD;  Location: [facility];  Service: Endoscopy;  Laterality: N/A;   HAND SURGERY     KNEE SURGERY     Social History   Tobacco Use   Smoking status: Former    Current packs/day: 0.50    Types: Cigarettes    Passive exposure: Never   Smokeless tobacco: Never  Vaping Use   Vaping status: Never Used  Substance Use Topics   Alcohol use: No   Drug use: No   Social History   Social History Narrative   Not on file      There is no immunization history on file for this patient.   Objective: Vital Signs: BP (!) 152/78   Pulse 67   Temp 97.8 F (36.6 C)   Resp 15   Ht 5' 10 (1.778 m)   Wt 200 lb 12.8 oz (91.1 kg)   BMI 28.81 kg/m    Physical Exam Vitals and nursing note reviewed.  Constitutional:      Appearance: He is well-developed.  HENT:     Head: Normocephalic and atraumatic.  Eyes:     Conjunctiva/sclera: Conjunctivae normal.     Pupils: Pupils are equal, round, and reactive to light.  Cardiovascular:     Rate and Rhythm: Normal rate and regular rhythm.     Heart sounds: Normal heart sounds.  Pulmonary:     Effort: Pulmonary effort is normal.     Breath sounds: Normal breath sounds.  Abdominal:     General: Bowel sounds are normal.     Palpations: Abdomen is soft.  Musculoskeletal:     Cervical back: Normal range of motion and neck supple.  Skin:    General: Skin is warm and dry.     Capillary Refill: Capillary refill takes less  than 2 seconds.  Neurological:     Mental Status: He is alert and oriented to person, place, and time.  Psychiatric:        Behavior: Behavior normal.      Musculoskeletal Exam: C-spine has limited range of motion with lateral rotation.  Patient was able to raise his arms above his head with more ease today.  He continues to have some discomfort with range of motion of both shoulders.  No tenderness or swelling along the elbow joint line.  No tenderness or synovitis of the wrist joints.  Limited extension of both wrist noted.  Thickening of bilateral first MCP  joints noted.  PIP and DIP thickening.  Hip range of motion has improved.  He continues to have some discomfort in the right hip with full flexion. Knee joints have good ROM with no warmth or effusion.  Ankle joints have good ROM with no tenderness or joint swelling.   CDAI Exam: CDAI Score: -- Patient Global: --; Provider Global: -- Swollen: --; Tender: -- Joint Exam 11/29/2024   No joint exam has been documented for this visit   There is currently no information documented on the homunculus. Go to the Rheumatology activity and complete the homunculus joint exam.  Investigation: No additional findings.  Imaging: No results found.   Recent Labs: Lab Results  Component Value Date   WBC 11.8 (H) 11/20/2024   HGB 13.8 11/20/2024   PLT 305 11/20/2024   NA 135 11/20/2024   K 5.1 11/20/2024   CL 98 11/20/2024   CO2 26 11/20/2024   GLUCOSE 253 (H) 11/20/2024   BUN 15 11/20/2024   CREATININE 1.17 11/20/2024   BILITOT 0.6 11/20/2024   ALKPHOS 68 08/16/2022   AST 19 11/20/2024   ALT 19 11/20/2024   PROT 6.6 11/20/2024   ALBUMIN 4.5 08/16/2022   CALCIUM  9.4 11/20/2024   GFRAA >60 09/08/2015   QFTBGOLDPLUS NEGATIVE 10/30/2024    Speciality Comments: No specialty comments available.  Procedures:  No procedures performed Allergies: Codeine    Assessment / Plan:     Visit Diagnoses: Polymyalgia rheumatica: Elevated  CRP, elevated WBC count, possible inflammatory arthritis, RF-, MCV-, anti-CCP-, highly responsive to prednisone  and ibuprofen , pain and stiffness in both shoulders and both hips: Patient has noticed a 60% improvement in the pain and stiffness affecting both shoulders and both hips on the current treatment regimen.  He is currently taking prednisone  10 mg daily and was started on methotrexate  and folic acid  after that last visit on 11/06/24.  He has taken methotrexate  0.6 mL sq injections once weekly x 2 weeks and has had 1 dose of 0.8 ml.  He plans on administering the second dose of methotrexate  0.8 mL tomorrow and will be returning for updated lab work next week.  He has noticed mild nausea but 24 hours after administering methotrexate  for the past 2 doses but is otherwise not had any side effects or injection site reactions.  Nausea has been tolerable and he does not want to reduce the dose or discontinue methotrexate  at this time.  His morning stiffness has improved to about 2 to 3 hours instead of lasting until mid afternoon.  His mobility has started to improve.  He has less difficulty raising his arms above the head and with hip ROM exercises.  Less difficulty rising from a seated position.  He will remain on methotrexate  and folic acid  with close lab monitoring.  Once he has reached the 6-week mark on methotrexate  discussed that we can consider reducing dose of prednisone  to 9 mg daily.  Discussed that the goal will be to try to reduce by 1 mg every month as tolerated.  Can consider a quicker taper if the symptoms continue to improve while on methotrexate .  He will follow up in 6-8 weeks.   Chronic inflammatory arthritis - CRP elevated, ESR WNL, RF-, anti-CCP-, MCV-: X-rays of both hands consistent with osteoarthritis and inflammatory arthritis overlap: He is status about a 60% improvement in his joint pain and stiffness since initiating methotrexate  and prednisone .  Plan to remain on Methotrexate  0.8 mL  sq injections once weekly and folic  acid 2 mg daily.  Plan to follow prednisone  taper as discussed above.  He will notify us  if he develops any new or worsening symptoms.  High risk medication use - Methotrexate  0.8 ml sq injections once weekly and folic acid  2 mg daily.   Prednisone  10 mg daily--plan to reduce prednisone  by 1 mg every 4 weeks or quicker if tolerated.   Baseline immunosuppressive labs obtained 10/30/24.  IFE normal. CXR 03/22/24.  Hgb A1c followed by Dr. Shayne. CBC and CMP updated on  11/20/24. He will return for updated lab work next week.  Standing orders for CBC and CMP released today.   Planning to proceed with RSV and shingrix vaccines as recommended by Dr. Shayne.    Primary osteoarthritis of both hands - XR consistent with osteoarthritis and possible inflammatory arthritis overlap.  Possible erosive changes noted.  Limited extension of both wrist joints. No synovitis noted today.   Chronic left shoulder pain - Evaluated at [facility] of the left shoulder from 08/29/2024: Glenohumeral joint is well-maintained.  No significant arthritic changes. Improving.    Chronic right shoulder pain: Improving.  More ease with ROM.  Less joint stiffness and pain.   Arthritis of left acromioclavicular joint: Discomfort and stiffness improving.   Bilateral hip pain - XR unremarkable 10/30/24: Suspicious for PMR, CRP elevated 28. Mobility has improved.  He was able to rise from a seated position with more ease today.  Hip flexion has improved significantly.   Chronic pain of left knee - Under care of [facility]-injection on 08/29/24.XR 06/04/2024 moderate to severe narrowing along the medial joint line.  Moderate patellofemoral changes noted.  No warmth or effusion noted today.   Other medical conditions are listed as follows:   Retinal microaneurysm of both eyes  PUD (peptic ulcer disease)  Gastroesophageal reflux disease without esophagitis  Colon adenomas  Moderate  nonproliferative diabetic retinopathy of both eyes without macular edema associated with type 2 diabetes mellitus (HCC)  Trigger finger of right thumb  Dyslipidemia  Gilbert's syndrome  Nuclear sclerotic cataract of both eyes  Orders: No orders of the defined types were placed in this encounter.  No orders of the defined types were placed in this encounter.    Follow-Up Instructions: Return for Polymyalgia Rheumatica.   Waddell CHRISTELLA Craze, PA-C  Note - This record has been created using Dragon software.  Chart creation errors have been sought, but may not always  have been located. Such creation errors do not reflect on  the standard of medical care.

## 2024-12-04 ENCOUNTER — Other Ambulatory Visit: Payer: Self-pay | Admitting: Physician Assistant

## 2024-12-04 NOTE — Telephone Encounter (Signed)
 Last Fill: 11/06/2024  Next Visit: 01/23/2025  Last Visit: 11/29/2024  Dx: Polymyalgia rheumatica   Current Dose per office note on 11/29/2024: Prednisone  10 mg daily   Okay to refill Prednisone ?

## 2024-12-11 ENCOUNTER — Other Ambulatory Visit: Payer: Self-pay

## 2024-12-11 DIAGNOSIS — M1380 Other specified arthritis, unspecified site: Secondary | ICD-10-CM

## 2024-12-11 DIAGNOSIS — Z79899 Other long term (current) drug therapy: Secondary | ICD-10-CM

## 2024-12-12 LAB — COMPREHENSIVE METABOLIC PANEL WITH GFR
AG Ratio: 2.1 (calc) (ref 1.0–2.5)
ALT: 18 U/L (ref 9–46)
AST: 18 U/L (ref 10–35)
Albumin: 4.6 g/dL (ref 3.6–5.1)
Alkaline phosphatase (APISO): 75 U/L (ref 35–144)
BUN: 14 mg/dL (ref 7–25)
CO2: 28 mmol/L (ref 20–32)
Calcium: 10 mg/dL (ref 8.6–10.3)
Chloride: 100 mmol/L (ref 98–110)
Creat: 0.99 mg/dL (ref 0.70–1.35)
Globulin: 2.2 g/dL (ref 1.9–3.7)
Glucose, Bld: 131 mg/dL — ABNORMAL HIGH (ref 65–99)
Potassium: 4.4 mmol/L (ref 3.5–5.3)
Sodium: 138 mmol/L (ref 135–146)
Total Bilirubin: 0.9 mg/dL (ref 0.2–1.2)
Total Protein: 6.8 g/dL (ref 6.1–8.1)
eGFR: 85 mL/min/1.73m2

## 2024-12-12 LAB — CBC WITH DIFFERENTIAL/PLATELET
Absolute Lymphocytes: 911 {cells}/uL (ref 850–3900)
Absolute Monocytes: 260 {cells}/uL (ref 200–950)
Basophils Absolute: 37 {cells}/uL (ref 0–200)
Basophils Relative: 0.4 %
Eosinophils Absolute: 28 {cells}/uL (ref 15–500)
Eosinophils Relative: 0.3 %
HCT: 44 % (ref 39.4–51.1)
Hemoglobin: 14.5 g/dL (ref 13.2–17.1)
MCH: 30 pg (ref 27.0–33.0)
MCHC: 33 g/dL (ref 31.6–35.4)
MCV: 90.9 fL (ref 81.4–101.7)
MPV: 10.9 fL (ref 7.5–12.5)
Monocytes Relative: 2.8 %
Neutro Abs: 8063 {cells}/uL — ABNORMAL HIGH (ref 1500–7800)
Neutrophils Relative %: 86.7 %
Platelets: 300 Thousand/uL (ref 140–400)
RBC: 4.84 Million/uL (ref 4.20–5.80)
RDW: 13 % (ref 11.0–15.0)
Total Lymphocyte: 9.8 %
WBC: 9.3 Thousand/uL (ref 3.8–10.8)

## 2024-12-13 ENCOUNTER — Ambulatory Visit: Payer: Self-pay | Admitting: Physician Assistant

## 2024-12-13 NOTE — Progress Notes (Signed)
 Glucose is 131, rest of CMP WNL  Absolute neutrophils remain borderline elevated but are trending down, rest of CBC WNL.

## 2025-01-01 ENCOUNTER — Other Ambulatory Visit: Payer: Self-pay | Admitting: Physician Assistant

## 2025-01-01 NOTE — Telephone Encounter (Signed)
 Plan to reduce at next visit.  Ok refill current dose of prednisone .

## 2025-01-01 NOTE — Telephone Encounter (Signed)
 Patient advised Plan to reduce at next visit. Ok refill current dose of prednisone . Patient verbalized understanding.

## 2025-01-01 NOTE — Telephone Encounter (Signed)
 Last Fill: 12/04/2024  Next Visit: 01/23/2025  Last Visit: 11/29/2024  Dx: Polymyalgia rheumatica   Current Dose per office note on 11/29/2024: Prednisone  10 mg daily--plan to reduce prednisone  by 1 mg every 4 weeks or quicker if tolerated.   Per office note on 11/29/2024: Once he has reached the 6-week mark on methotrexate  discussed that we can consider reducing dose of prednisone  to 9 mg daily.   Prescription is still at 10 mg daily. Please advise if patient is changing to 9 mg daily now or at the follow up visit in February.   Okay to refill Prednisone ?

## 2025-01-17 ENCOUNTER — Other Ambulatory Visit: Payer: Self-pay

## 2025-01-17 DIAGNOSIS — M1380 Other specified arthritis, unspecified site: Secondary | ICD-10-CM

## 2025-01-17 DIAGNOSIS — Z79899 Other long term (current) drug therapy: Secondary | ICD-10-CM

## 2025-01-17 MED ORDER — "TUBERCULIN-ALLERGY SYRINGES 27G X 1/2"" 1 ML MISC": 3 refills | Status: AC

## 2025-01-17 MED ORDER — METHOTREXATE SODIUM CHEMO INJECTION 50 MG/2ML: 20.0000 mg | INTRAMUSCULAR | 0 refills | Status: AC

## 2025-01-17 NOTE — Telephone Encounter (Signed)
 Patient left a voicemail to request refills of methotrexate  and syringes. Called patient and he would like prescriptions sent to [facility] in [place].   Last Fill: 11/06/2024   Labs: 12/11/2024 Glucose is 131, rest of CMP WNL  Absolute neutrophils remain borderline elevated but are trending down, rest of CBC WNL.   Next Visit: 01/23/2025  Last Visit: 11/29/2024  DX: Polymyalgia rheumatica   Current Dose per office note on 11/29/2024: Methotrexate  0.8 ml sq injections once weekly   Okay to refill Methotrexate ?

## 2025-01-23 ENCOUNTER — Encounter: Payer: Self-pay | Admitting: Physician Assistant

## 2025-01-23 ENCOUNTER — Telehealth: Payer: Self-pay

## 2025-01-23 ENCOUNTER — Ambulatory Visit: Admitting: Physician Assistant

## 2025-01-23 VITALS — BP 134/67 | HR 88 | Temp 98.2°F | Resp 14 | Ht 70.0 in | Wt 205.6 lb

## 2025-01-23 DIAGNOSIS — K279 Peptic ulcer, site unspecified, unspecified as acute or chronic, without hemorrhage or perforation: Secondary | ICD-10-CM

## 2025-01-23 DIAGNOSIS — K219 Gastro-esophageal reflux disease without esophagitis: Secondary | ICD-10-CM | POA: Diagnosis not present

## 2025-01-23 DIAGNOSIS — D126 Benign neoplasm of colon, unspecified: Secondary | ICD-10-CM

## 2025-01-23 DIAGNOSIS — M19012 Primary osteoarthritis, left shoulder: Secondary | ICD-10-CM

## 2025-01-23 DIAGNOSIS — M25562 Pain in left knee: Secondary | ICD-10-CM | POA: Diagnosis not present

## 2025-01-23 DIAGNOSIS — E113393 Type 2 diabetes mellitus with moderate nonproliferative diabetic retinopathy without macular edema, bilateral: Secondary | ICD-10-CM

## 2025-01-23 DIAGNOSIS — M25511 Pain in right shoulder: Secondary | ICD-10-CM

## 2025-01-23 DIAGNOSIS — Z79899 Other long term (current) drug therapy: Secondary | ICD-10-CM

## 2025-01-23 DIAGNOSIS — M65311 Trigger thumb, right thumb: Secondary | ICD-10-CM | POA: Diagnosis not present

## 2025-01-23 DIAGNOSIS — M25551 Pain in right hip: Secondary | ICD-10-CM | POA: Diagnosis not present

## 2025-01-23 DIAGNOSIS — H35043 Retinal micro-aneurysms, unspecified, bilateral: Secondary | ICD-10-CM

## 2025-01-23 DIAGNOSIS — M353 Polymyalgia rheumatica: Secondary | ICD-10-CM

## 2025-01-23 DIAGNOSIS — M25552 Pain in left hip: Secondary | ICD-10-CM

## 2025-01-23 DIAGNOSIS — M1380 Other specified arthritis, unspecified site: Secondary | ICD-10-CM

## 2025-01-23 DIAGNOSIS — M19042 Primary osteoarthritis, left hand: Secondary | ICD-10-CM

## 2025-01-23 DIAGNOSIS — G8929 Other chronic pain: Secondary | ICD-10-CM

## 2025-01-23 DIAGNOSIS — E785 Hyperlipidemia, unspecified: Secondary | ICD-10-CM

## 2025-01-23 DIAGNOSIS — H2513 Age-related nuclear cataract, bilateral: Secondary | ICD-10-CM

## 2025-01-23 MED ORDER — PREDNISONE 1 MG PO TABS: ORAL_TABLET | ORAL | 0 refills | Status: AC

## 2025-01-23 MED ORDER — PREDNISONE 5 MG PO TABS: ORAL_TABLET | ORAL | 0 refills | Status: AC

## 2025-01-23 NOTE — Telephone Encounter (Signed)
-----   Message from Waddell CHRISTELLA Craze sent at 01/23/2025  3:54 PM EST ----- Please pend prescription for prednisone  9 mg daily.  We will need to send in 5 mg tablets and 1 mg tablets to total 9 mg daily.

## 2025-01-23 NOTE — Patient Instructions (Signed)
 Standing Labs We placed an order today for your standing lab work.   Please have your standing labs drawn in March and every 3 months   Please have your labs drawn 2 weeks prior to your appointment so that the provider can discuss your lab results at your appointment, if possible.  Please note that you may see your imaging and lab results in MyChart before we have reviewed them. We will contact you once all results are reviewed. Please allow our office up to 72 hours to thoroughly review all of the results before contacting the office for clarification of your results.  WALK-IN LAB HOURS  Monday through Thursday from 8:00 am - 4:00 pm and Friday from 8:00 am-12:00 pm.  Patients with office visits requiring labs will be seen before walk-in labs.  You may encounter longer than normal wait times. Please allow additional time. Wait times may be shorter on  Monday and Thursday afternoons.  We do not book appointments for walk-in labs. We appreciate your patience and understanding with our staff.   Labs are drawn by [facility]. Please bring your co-pay at the time of your lab draw.  You may receive a bill from [facility] for your lab work.  Please note if you are on Hydroxychloroquine and and an order has been placed for a Hydroxychloroquine level,  you will need to have it drawn 4 hours or more after your last dose.  If you wish to have your labs drawn at another location, please call the office 24 hours in advance so we can fax the orders.  The office is located at [address], [address], [place], [place] [address]   If you have any questions regarding directions or hours of operation,  please call [phone].   As a reminder, please drink plenty of water prior to coming for your lab work. Thanks!

## 2025-01-23 NOTE — Telephone Encounter (Signed)
 Prescriptions pended in patient's visit today and ready to be reviewed and signed.

## 2025-04-23 ENCOUNTER — Ambulatory Visit: Admitting: Physician Assistant
